# Patient Record
Sex: Male | Born: 1975 | Race: Black or African American | Hispanic: No | Marital: Single | Smoking: Current every day smoker
Health system: Southern US, Community
[De-identification: ages and names within clinical notes are randomized; demographics above are authoritative.]

## PROBLEM LIST (undated history)

## (undated) DIAGNOSIS — N200 Calculus of kidney: Secondary | ICD-10-CM

## (undated) HISTORY — PX: OTHER SURGICAL HISTORY: SHX169

---

## 2006-09-11 ENCOUNTER — Ambulatory Visit (HOSPITAL_BASED_OUTPATIENT_CLINIC_OR_DEPARTMENT_OTHER): Admission: RE | Admit: 2006-09-11 | Discharge: 2006-09-11 | Payer: Self-pay | Admitting: Urology

## 2006-09-11 ENCOUNTER — Emergency Department (HOSPITAL_COMMUNITY): Admission: EM | Admit: 2006-09-11 | Discharge: 2006-09-11 | Payer: Self-pay | Admitting: Emergency Medicine

## 2007-02-01 ENCOUNTER — Emergency Department (HOSPITAL_COMMUNITY): Admission: EM | Admit: 2007-02-01 | Discharge: 2007-02-01 | Payer: Self-pay | Admitting: Emergency Medicine
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
## 2011-05-09 LAB — URINALYSIS, ROUTINE W REFLEX MICROSCOPIC
Bilirubin Urine: NEGATIVE
Nitrite: NEGATIVE
Urobilinogen, UA: 0.2

## 2011-05-09 LAB — I-STAT 8, (EC8 V) (CONVERTED LAB)
BUN: 15
Bicarbonate: 24.1 — ABNORMAL HIGH
Chloride: 108
Glucose, Bld: 88
Potassium: 4
Sodium: 139

## 2011-05-09 LAB — COMPREHENSIVE METABOLIC PANEL
ALT: 36
AST: 48 — ABNORMAL HIGH
BUN: 14
Calcium: 8.9
Glucose, Bld: 89
Potassium: 4
Total Protein: 7

## 2011-05-09 LAB — POCT I-STAT CREATININE
Creatinine, Ser: 1.2
Operator id: 198171

## 2011-05-09 LAB — URINE CULTURE
Colony Count: NO GROWTH
Culture: NO GROWTH

## 2011-05-09 LAB — URINE MICROSCOPIC-ADD ON

## 2011-09-29 ENCOUNTER — Emergency Department (HOSPITAL_COMMUNITY)
Admission: EM | Admit: 2011-09-29 | Discharge: 2011-09-29 | Disposition: A | Discharge status: Home / Self Care | Payer: Self-pay | Attending: Emergency Medicine | Admitting: Emergency Medicine

## 2011-09-29 ENCOUNTER — Emergency Department (HOSPITAL_COMMUNITY): Payer: Self-pay

## 2011-09-29 ENCOUNTER — Encounter (HOSPITAL_COMMUNITY): Payer: Self-pay | Admitting: Emergency Medicine

## 2011-09-29 DIAGNOSIS — B9789 Other viral agents as the cause of diseases classified elsewhere: Secondary | ICD-10-CM | POA: Insufficient documentation

## 2011-09-29 DIAGNOSIS — R0602 Shortness of breath: Secondary | ICD-10-CM | POA: Insufficient documentation

## 2011-09-29 DIAGNOSIS — R197 Diarrhea, unspecified: Secondary | ICD-10-CM | POA: Insufficient documentation

## 2011-09-29 DIAGNOSIS — F17200 Nicotine dependence, unspecified, uncomplicated: Secondary | ICD-10-CM | POA: Insufficient documentation

## 2011-09-29 DIAGNOSIS — R05 Cough: Secondary | ICD-10-CM | POA: Insufficient documentation

## 2011-09-29 DIAGNOSIS — B349 Viral infection, unspecified: Secondary | ICD-10-CM

## 2011-09-29 DIAGNOSIS — J45909 Unspecified asthma, uncomplicated: Secondary | ICD-10-CM | POA: Insufficient documentation

## 2011-09-29 DIAGNOSIS — R112 Nausea with vomiting, unspecified: Secondary | ICD-10-CM | POA: Insufficient documentation

## 2011-09-29 DIAGNOSIS — R059 Cough, unspecified: Secondary | ICD-10-CM | POA: Insufficient documentation

## 2011-09-29 DIAGNOSIS — J3489 Other specified disorders of nose and nasal sinuses: Secondary | ICD-10-CM | POA: Insufficient documentation

## 2011-09-29 MED ORDER — ONDANSETRON HCL 4 MG PO TABS: 4.0000 mg | ORAL_TABLET | Freq: Four times a day (QID) | ORAL | Status: AC | PRN

## 2011-09-29 MED ORDER — IPRATROPIUM BROMIDE 0.02 % IN SOLN
0.5000 mg | Freq: Once | RESPIRATORY_TRACT | Status: AC
  Administered 2011-09-29: 0.5 mg via RESPIRATORY_TRACT
  Filled 2011-09-29: qty 2.5

## 2011-09-29 MED ORDER — ALBUTEROL SULFATE (5 MG/ML) 0.5% IN NEBU
5.0000 mg | INHALATION_SOLUTION | Freq: Once | RESPIRATORY_TRACT | Status: AC
  Administered 2011-09-29: 5 mg via RESPIRATORY_TRACT
  Filled 2011-09-29: qty 1

## 2011-09-29 MED ORDER — DICYCLOMINE HCL 10 MG PO CAPS
20.0000 mg | ORAL_CAPSULE | Freq: Once | ORAL | Status: AC
  Administered 2011-09-29: 20 mg via ORAL
  Filled 2011-09-29: qty 2

## 2011-09-29 NOTE — ED Notes (Signed)
Pt here for flu sx with vomiting and generalized body aches and fever x 2 days

## 2011-09-29 NOTE — Discharge Instructions (Signed)
Viral Infections A virus is a type of germ. Viruses can cause:  Minor sore throats.   Aches and pains.   Headaches.   Runny nose.   Rashes.   Watery eyes.   Tiredness.   Coughs.   Loss of appetite.   Feeling sick to your stomach (nausea).   Throwing up (vomiting).   Watery poop (diarrhea).  HOME CARE   Only take medicines as told by your doctor.   Drink enough water and fluids to keep your pee (urine) clear or pale yellow. Sports drinks are a good choice.   Get plenty of rest and eat healthy. Soups and broths with crackers or rice are fine.  GET HELP RIGHT AWAY IF:   You have a very bad headache.   You have shortness of breath.   You have chest pain or neck pain.   You have an unusual rash.   You cannot stop throwing up.   You have watery poop that does not stop.   You cannot keep fluids down.   You or your child has a temperature by mouth above 102 F (38.9 C), not controlled by medicine.   Your baby is older than 3 months with a rectal temperature of 102 F (38.9 C) or higher.   Your baby is 12 months old or younger with a rectal temperature of 100.4 F (38 C) or higher.  MAKE SURE YOU:   Understand these instructions.   Will watch this condition.   Will get help right away if you are not doing well or get worse.  Document Released: 06/22/2008 Document Revised: 06/29/2011 Document Reviewed: 11/15/2010 [facility] Patient Information 2012 [place], [place].            Antibiotic Nonuse  Your caregiver felt that the infection or problem was not one that would be helped with an antibiotic. Infections may be caused by viruses or bacteria. Only a caregiver can tell which one of these is the likely cause of an illness. A cold is the most common cause of infection in both adults and children. A cold is a virus. Antibiotic treatment will have no effect on a viral infection. Viruses can lead to many lost days of work caring for sick children and many  missed days of school. Children may catch as many as 10 "colds" or "flus" per year during which they can be tearful, cranky, and uncomfortable. The goal of treating a virus is aimed at keeping the ill person comfortable. Antibiotics are medications used to help the body fight bacterial infections. There are relatively few types of bacteria that cause infections but there are hundreds of viruses. While both viruses and bacteria cause infection they are very different types of germs. A viral infection will typically go away by itself within 7 to 10 days. Bacterial infections may spread or get worse without antibiotic treatment. Examples of bacterial infections are:  Sore throats (like strep throat or tonsillitis).   Infection in the lung (pneumonia).   Ear and skin infections.  Examples of viral infections are:  Colds or flus.   Most coughs and bronchitis.   Sore throats not caused by Strep.   Runny noses.  It is often best not to take an antibiotic when a viral infection is the cause of the problem. Antibiotics can kill off the helpful bacteria that we have inside our body and allow harmful bacteria to start growing. Antibiotics can cause side effects such as allergies, nausea, and diarrhea without helping to improve  the symptoms of the viral infection. Additionally, repeated uses of antibiotics can cause bacteria inside of our body to become resistant. That resistance can be passed onto harmful bacterial. The next time you have an infection it may be harder to treat if antibiotics are used when they are not needed. Not treating with antibiotics allows our own immune system to develop and take care of infections more efficiently. Also, antibiotics will work better for [place] when they are prescribed for bacterial infections. Treatments for a child that is ill may include:  Give extra fluids throughout the day to stay hydrated.   Get plenty of rest.   Only give your child over-the-counter or  prescription medicines for pain, discomfort, or fever as directed by your caregiver.   The use of a cool mist humidifier may help stuffy noses.   Cold medications if suggested by your caregiver.  Your caregiver may decide to start you on an antibiotic if:  The problem you were seen for today continues for a longer length of time than expected.   You develop a secondary bacterial infection.  SEEK MEDICAL CARE IF:  Fever lasts longer than 5 days.   Symptoms continue to get worse after 5 to 7 days or become severe.   Difficulty in breathing develops.   Signs of dehydration develop (poor drinking, rare urinating, dark colored urine).   Changes in behavior or worsening tiredness (listlessness or lethargy).  Document Released: 09/18/2001 Document Revised: 06/29/2011 Document Reviewed: 03/17/2009 [facility] Patient Information 2012 [place], [place].

## 2011-09-29 NOTE — ED Provider Notes (Signed)
History     CSN: [id number]  Arrival date & time 09/29/11  1241   First MD Initiated Contact with Patient 09/29/11 1423      Chief Complaint  Patient presents with  . Influenza  . Emesis    (Consider location/radiation/quality/duration/timing/severity/associated sxs/prior treatment) The history is provided by the patient.  Pt is a 36 y/o M with a hx of asthma who presents to ED with c/c of 2 days acute onset N/V/D with mild intermittent generalized moderate intensity crampy abdominal pain. Vomit non-bloody, appearance c/w undigested food. Diarrhea non-bloody. In the same timeframe, he has had chills, nasal congestion, ear fullness, sore throat, non-prod cough, mild shortness of breath, and myalgias. Denies known fever. Denies shortness of breath. Nothing makes his symptoms better or worse. No prior tx has been attempted. No recent travel. No known sick contacts.   Past Medical History  Diagnosis Date  . Asthma     History reviewed. No pertinent past surgical history.  History reviewed. No pertinent family history.  History  Substance Use Topics  . Smoking status: Current Everyday Smoker  . Smokeless tobacco: Not on file  . Alcohol Use: Yes      Review of Systems 10 systems reviewed and are negative for acute change except as noted in the HPI.  Allergies  Review of patient's allergies indicates no known allergies.  Home Medications   Current Outpatient Rx  Name Route Sig Dispense Refill  . ALBUTEROL SULFATE HFA 108 (90 BASE) MCG/ACT IN AERS Inhalation Inhale 2 puffs into the lungs every 6 (six) hours as needed. For shortness of breath      BP 128/80  Pulse 87  Temp(Src) 98.6 F (37 C) (Oral)  Resp 18  Ht 5\' 7"  (1.702 m)  Wt 160 lb (72.576 kg)  BMI 25.06 kg/m2  SpO2 97%  Physical Exam  Nursing note and vitals reviewed. Constitutional: He is oriented to person, place, and time. He appears well-developed and well-nourished.       Ill-appearing  HENT:  Head:  Normocephalic and atraumatic.  Right Ear: External ear normal.  Left Ear: External ear normal.       Bilateral TM injected. Clear nasal congestion. Pharynx erythematous without edema or exudate.   Eyes: Conjunctivae are normal. Pupils are equal, round, and reactive to light.  Neck: Normal range of motion. Neck supple.  Cardiovascular: Normal rate, regular rhythm, normal heart sounds and intact distal pulses.   Pulmonary/Chest: Effort normal. No respiratory distress. He has decreased breath sounds in the right upper field and the left upper field. He has no wheezes. He has no rhonchi.  Abdominal: Soft. Bowel sounds are normal. He exhibits no distension.  Musculoskeletal: He exhibits no edema and no tenderness.  Lymphadenopathy:    He has no cervical adenopathy.  Neurological: He is alert and oriented to person, place, and time. No cranial nerve deficit.  Skin: Skin is warm and dry. No rash noted.  Psychiatric: He has a normal mood and affect.    ED Course  Procedures (including critical care time)  Labs Reviewed - No data to display Dg Chest 2 View  09/29/2011  *RADIOLOGY REPORT*  Clinical Data: Cough, congestion and shortness of breath.  CHEST - 2 VIEW  Comparison: No priors.  Findings: Lung volumes are normal.  No consolidative airspace disease.  No pleural effusions.  No pneumothorax.  No pulmonary nodule or mass noted.  Pulmonary vasculature and the cardiomediastinal silhouette are within normal limits.  IMPRESSION: 1. No  radiographic evidence of acute cardiopulmonary disease.  Original Report Authenticated By: Florencia Reasons, M.D.     1. Viral illness       MDM  Viral illness s/s. AF, VSS. CXR with no evidence of pneumonia, good air movement after neb breathing tx- pt advised to use at-home inhaler. He is tolerated a by mouth trial in emergency department. There've been no further episodes of vomiting or diarrhea in the ER. He will be discharged home with a prescription for an  antiemetic. As he has had no fever, I do not suspect influenza in this patient.        Shaaron Adler, [place] 09/29/11 1750

## 2011-09-29 NOTE — ED Notes (Signed)
Ambulatory with relief of pain now rated at 6/10.  No N./V/D at this time.

## 2011-09-29 NOTE — ED Provider Notes (Signed)
Medical screening examination/treatment/procedure(s) were performed by non-physician practitioner and as supervising physician I was immediately available for consultation/collaboration.  Ethelda Chick, MD 09/29/11 [phone]

## 2011-09-29 NOTE — ED Notes (Signed)
Pt given lunch bag and sprite 

## 2011-10-28 ENCOUNTER — Encounter (HOSPITAL_COMMUNITY): Payer: Self-pay | Admitting: *Deleted

## 2011-10-28 ENCOUNTER — Emergency Department (HOSPITAL_COMMUNITY): Payer: Self-pay

## 2011-10-28 ENCOUNTER — Emergency Department (HOSPITAL_COMMUNITY)
Admission: EM | Admit: 2011-10-28 | Discharge: 2011-10-28 | Disposition: A | Discharge status: Home / Self Care | Payer: Self-pay | Attending: Emergency Medicine | Admitting: Emergency Medicine

## 2011-10-28 DIAGNOSIS — J45909 Unspecified asthma, uncomplicated: Secondary | ICD-10-CM | POA: Insufficient documentation

## 2011-10-28 DIAGNOSIS — F17200 Nicotine dependence, unspecified, uncomplicated: Secondary | ICD-10-CM | POA: Insufficient documentation

## 2011-10-28 DIAGNOSIS — M549 Dorsalgia, unspecified: Secondary | ICD-10-CM | POA: Insufficient documentation

## 2011-10-28 DIAGNOSIS — R109 Unspecified abdominal pain: Secondary | ICD-10-CM | POA: Insufficient documentation

## 2011-10-28 LAB — URINALYSIS, ROUTINE W REFLEX MICROSCOPIC
Ketones, ur: 15 mg/dL — AB
Protein, ur: NEGATIVE mg/dL
Specific Gravity, Urine: 1.024 (ref 1.005–1.030)
pH: 6 (ref 5.0–8.0)

## 2011-10-28 LAB — COMPREHENSIVE METABOLIC PANEL
Albumin: 4 g/dL (ref 3.5–5.2)
Alkaline Phosphatase: 101 U/L (ref 39–117)
BUN: 8 mg/dL (ref 6–23)
Calcium: 9 mg/dL (ref 8.4–10.5)
Creatinine, Ser: 0.83 mg/dL (ref 0.50–1.35)
Total Bilirubin: 0.2 mg/dL — ABNORMAL LOW (ref 0.3–1.2)

## 2011-10-28 LAB — DIFFERENTIAL
Basophils Absolute: 0 10*3/uL (ref 0.0–0.1)
Lymphocytes Relative: 23 % (ref 12–46)
Lymphs Abs: 2.1 10*3/uL (ref 0.7–4.0)
Neutrophils Relative %: 61 % (ref 43–77)

## 2011-10-28 LAB — CBC
HCT: 44.5 % (ref 39.0–52.0)
MCH: 30.9 pg (ref 26.0–34.0)
MCV: 92.3 fL (ref 78.0–100.0)
RBC: 4.82 MIL/uL (ref 4.22–5.81)
RDW: 12.6 % (ref 11.5–15.5)

## 2011-10-28 MED ORDER — TRAMADOL HCL 50 MG PO TABS: 50.0000 mg | ORAL_TABLET | Freq: Four times a day (QID) | ORAL | Status: AC | PRN

## 2011-10-28 MED ORDER — ONDANSETRON HCL 4 MG/2ML IJ SOLN
4.0000 mg | Freq: Once | INTRAMUSCULAR | Status: AC
  Administered 2011-10-28: 4 mg via INTRAVENOUS
  Filled 2011-10-28: qty 2

## 2011-10-28 MED ORDER — KETOROLAC TROMETHAMINE 30 MG/ML IJ SOLN
30.0000 mg | Freq: Once | INTRAMUSCULAR | Status: AC
  Administered 2011-10-28: 30 mg via INTRAVENOUS
  Filled 2011-10-28: qty 1

## 2011-10-28 MED ORDER — PROMETHAZINE HCL 25 MG PO TABS: 25.0000 mg | ORAL_TABLET | Freq: Four times a day (QID) | ORAL | Status: DC | PRN

## 2011-10-28 MED ORDER — SODIUM CHLORIDE 0.9 % IV SOLN
Freq: Once | INTRAVENOUS | Status: AC
  Administered 2011-10-28: 09:00:00 via INTRAVENOUS

## 2011-10-28 NOTE — ED Notes (Signed)
To ed for eval of left flank pain. Hx of kidney stones with same symptoms.

## 2011-10-28 NOTE — ED Provider Notes (Signed)
History     CSN: [id number]  Arrival date & time 10/28/11  4098   First MD Initiated Contact with Patient 10/28/11 [phone]      Chief Complaint  Patient presents with  . Flank Pain    (Consider location/radiation/quality/duration/timing/severity/associated sxs/prior treatment) Patient is a 36 y.o. male presenting with flank pain. The history is provided by the patient (Patient complains of left flank pain he states he's had a kidney stone before and this feels similar. Mild nausea and sweating). No language interpreter was used.  Flank Pain This is a new problem. The current episode started yesterday. The problem occurs constantly. The problem has not changed since onset.Pertinent negatives include no chest pain, no abdominal pain and no headaches. The symptoms are aggravated by nothing. The symptoms are relieved by nothing. He has tried nothing for the symptoms. The treatment provided no relief.    Past Medical History  Diagnosis Date  . Asthma   . Renal disorder     History reviewed. No pertinent past surgical history.  History reviewed. No pertinent family history.  History  Substance Use Topics  . Smoking status: Current Everyday Smoker  . Smokeless tobacco: Not on file  . Alcohol Use: Yes      Review of Systems  Constitutional: Negative for fatigue.  HENT: Negative for congestion, sinus pressure and ear discharge.   Eyes: Negative for discharge.  Respiratory: Negative for cough.   Cardiovascular: Negative for chest pain.  Gastrointestinal: Negative for abdominal pain and diarrhea.  Genitourinary: Positive for flank pain. Negative for frequency and hematuria.  Musculoskeletal: Negative for back pain.  Skin: Negative for rash.  Neurological: Negative for seizures and headaches.  Hematological: Negative.   Psychiatric/Behavioral: Negative for hallucinations.    Allergies  Review of patient's allergies indicates no known allergies.  Home Medications   Current  Outpatient Rx  Name Route Sig Dispense Refill  . ALBUTEROL SULFATE HFA 108 (90 BASE) MCG/ACT IN AERS Inhalation Inhale 2 puffs into the lungs every 6 (six) hours as needed. For shortness of breath    . ALBUTEROL SULFATE (2.5 MG/3ML) 0.083% IN NEBU Nebulization Take 2.5 mg by nebulization every 6 (six) hours as needed. As needed for asthma.    Marland Kitchen PROMETHAZINE HCL 25 MG PO TABS Oral Take 1 tablet (25 mg total) by mouth every 6 (six) hours as needed for nausea. 15 tablet 0  . TRAMADOL HCL 50 MG PO TABS Oral Take 1 tablet (50 mg total) by mouth every 6 (six) hours as needed for pain. 20 tablet 0    BP 105/59  Pulse 67  Temp(Src) 98 F (36.7 C) (Oral)  Resp 20  SpO2 98%  Physical Exam  Constitutional: He is oriented to person, place, and time. He appears well-developed.  HENT:  Head: Normocephalic and atraumatic.  Eyes: Conjunctivae and EOM are normal. No scleral icterus.  Neck: Neck supple. No thyromegaly present.  Cardiovascular: Normal rate and regular rhythm.  Exam reveals no gallop and no friction rub.   No murmur heard. Pulmonary/Chest: No stridor. He has no wheezes. He has no rales. He exhibits no tenderness.  Abdominal: He exhibits no distension. There is no tenderness. There is no rebound.  Genitourinary:       Mild left flank pain  Musculoskeletal: Normal range of motion. He exhibits no edema.  Lymphadenopathy:    He has no cervical adenopathy.  Neurological: He is oriented to person, place, and time. Coordination normal.  Skin: No rash noted. No erythema.  Psychiatric: He has a normal mood and affect. His behavior is normal.    ED Course  Procedures (including critical care time)  Labs Reviewed  DIFFERENTIAL - Abnormal; Notable for the following:    Eosinophils Relative 9 (*)    Eosinophils Absolute 0.8 (*)    All other components within normal limits  COMPREHENSIVE METABOLIC PANEL - Abnormal; Notable for the following:    AST 47 (*)    Total Bilirubin 0.2 (*)     All other components within normal limits  URINALYSIS, ROUTINE W REFLEX MICROSCOPIC - Abnormal; Notable for the following:    Ketones, ur 15 (*)    All other components within normal limits  CBC   Ct Abdomen Pelvis Wo Contrast  10/28/2011  *RADIOLOGY REPORT*  Clinical Data: Left flank pain, hematuria  CT ABDOMEN AND PELVIS WITHOUT CONTRAST  Technique:  Multidetector CT imaging of the abdomen and pelvis was performed following the standard protocol without intravenous contrast.  Comparison: 02/01/2007  Findings: Sagittal images of the spine are unremarkable.  Lung bases are unremarkable.  The unenhanced liver, spleen, pancreas and adrenals are unremarkable.  Unenhanced kidneys are symmetrical in size.  There is nonobstructive calcified calculus mid pole of the right kidney measures 4 mm.  No hydronephrosis or hydroureter.  No calcified ureteral calculi are identified.  No aortic aneurysm.  No small bowel obstruction.  No ascites or free air.  No adenopathy.  Normal appendix is partially visualized axial image 66.  Bilateral distal ureter is unremarkable.  Pelvic phleboliths are stable from prior exam.  Some gas noted in the rectum.  No destructive bony lesions are noted within pelvis. No calcified calculi are noted within urinary bladder.  IMPRESSION:  1.  There is right nonobstructive nephrolithiasis.  No hydronephrosis or hydroureter. 2.  No calcified ureteral calculi are noted. 3.  Normal appendix is clearly visualized. 4.  No calcified calculi are noted within urinary bladder.  Original Report Authenticated By: Natasha Mead, M.D.     1. Back pain       MDM  Flank pain,  Possible passed kidney stone        Benny Lennert, MD 10/28/11 1429

## 2011-10-28 NOTE — ED Notes (Signed)
Pt reporting left sided flank pain x 1 day. Reports yesterday developed throbbing left flank pain. Pain has persisted. Urine is brown/dark. Denying any hematuria. Reporting burning upon urination. Hx kidney stones, last one 2 years ago. Pt a & o x 4. Ambulated from triage to room without difficulty.

## 2011-10-28 NOTE — Discharge Instructions (Signed)
Drink plenty of fluids  Follow up as needed

## 2011-11-03 ENCOUNTER — Emergency Department (HOSPITAL_COMMUNITY)
Admission: EM | Admit: 2011-11-03 | Discharge: 2011-11-04 | Disposition: A | Discharge status: Home / Self Care | Payer: Self-pay | Attending: Emergency Medicine | Admitting: Emergency Medicine

## 2011-11-03 ENCOUNTER — Encounter (HOSPITAL_COMMUNITY): Payer: Self-pay | Admitting: Emergency Medicine

## 2011-11-03 DIAGNOSIS — F3289 Other specified depressive episodes: Secondary | ICD-10-CM | POA: Insufficient documentation

## 2011-11-03 DIAGNOSIS — F329 Major depressive disorder, single episode, unspecified: Secondary | ICD-10-CM

## 2011-11-03 DIAGNOSIS — J45909 Unspecified asthma, uncomplicated: Secondary | ICD-10-CM | POA: Insufficient documentation

## 2011-11-03 DIAGNOSIS — R45851 Suicidal ideations: Secondary | ICD-10-CM | POA: Insufficient documentation
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
## 2011-11-04 LAB — CBC
HCT: 39.5 % (ref 39.0–52.0)
MCV: 93.4 fL (ref 78.0–100.0)
RDW: 13.4 % (ref 11.5–15.5)
WBC: 8.7 10*3/uL (ref 4.0–10.5)

## 2011-11-04 LAB — COMPREHENSIVE METABOLIC PANEL
BUN: 12 mg/dL (ref 6–23)
CO2: 24 mEq/L (ref 19–32)
Chloride: 104 mEq/L (ref 96–112)
Creatinine, Ser: 0.93 mg/dL (ref 0.50–1.35)
GFR calc Af Amer: >90 mL/min (ref >90)
GFR calc non Af Amer: >90 mL/min (ref >90)
Total Bilirubin: 0.3 mg/dL (ref 0.3–1.2)

## 2011-11-04 LAB — ACETAMINOPHEN LEVEL: Acetaminophen (Tylenol), Serum: <15 ug/mL (ref 10–30)

## 2011-11-04 LAB — ETHANOL: Alcohol, Ethyl (B): <11 mg/dL (ref 0–11)

## 2011-11-04 MED ORDER — LORAZEPAM 1 MG PO TABS: 1.0000 mg | ORAL_TABLET | Freq: Three times a day (TID) | ORAL | Status: DC | PRN

## 2011-11-04 MED ORDER — ALBUTEROL SULFATE HFA 108 (90 BASE) MCG/ACT IN AERS: 2.0000 | INHALATION_SPRAY | RESPIRATORY_TRACT | Status: DC | PRN

## 2011-11-04 MED ORDER — CITALOPRAM HYDROBROMIDE 20 MG PO TABS: 20.0000 mg | ORAL_TABLET | ORAL | Status: DC

## 2011-11-04 MED ORDER — NICOTINE 21 MG/24HR TD PT24
21.0000 mg | MEDICATED_PATCH | Freq: Every day | TRANSDERMAL | Status: DC
  Administered 2011-11-04: 21 mg via TRANSDERMAL
  Filled 2011-11-04: qty 1

## 2011-11-04 NOTE — ED Provider Notes (Addendum)
Patient had to psych consultation today. She has been cleared for discharge by the telemetry psychiatrist. He'll be placed on Celexa per their recommendations and will be given outpatient referrals At time of d/c, pt without si/hi. He has been given outpateint referalls. Toy Baker, MD 11/04/11 1610  Toy Baker, MD 11/04/11 1342  Toy Baker, MD 11/04/11 [phone]

## 2011-11-04 NOTE — ED Notes (Signed)
Patient resting quietly in bed with eyes closed. resp even and unlabored. Sitter remains at bedside.  Assumed care of patient.

## 2011-11-04 NOTE — ED Notes (Signed)
Asleep; sitter remains at bedside.

## 2011-11-04 NOTE — BH Assessment (Signed)
Assessment Note   [name] is a 36 y.o. male who presents to [facility] under IVC, endorsing SI with plan to hang himself. Pt reports this week he lost his job and discovered that his girlfriend has been cheating on him. Pt reports "life is not worth living anymore, I should not be on this earth." Pt reports he is "tired of trying" and that he has frequent crying spells. He states he has been depressed on and off through his life, but never to this extent. Pt reports that he has not attempted suicide because of his children. He states that he has been attempting to get custody of his 2 children, he states he worries constantly about "what I'll do if I can't get them." Pt denies past suicide attempts, past hospitalizations, and reports no current psychiatrist or therapist. Pt also states he is not on any medications. Pt denies any SA but is positive for cocaine and THC. He does report a history of SA. Pt denies HI, stating he would hurt himself before hurting others. Pt was tearful during assessment. Pt was calm and cooperative and oriented x4.                Axis I: Major Depression, single episode Axis II: Deferred Axis III:  Past Medical History  Diagnosis Date  . Asthma   . Renal disorder    Axis IV: other psychosocial or environmental problems and problems related to social environment Axis V: 21-30 behavior considerably influenced by delusions or hallucinations OR serious impairment in judgment, communication OR inability to function in almost all areas  Past Medical History:  Past Medical History  Diagnosis Date  . Asthma   . Renal disorder     History reviewed. No pertinent past surgical history.  Family History: History reviewed. No pertinent family history.  Social History:  reports that he has been smoking.  He does not have any smokeless tobacco history on file. He reports that he drinks alcohol. He reports that he does not use illicit drugs.  Additional Social History:   Alcohol / Drug Use History of alcohol / drug use?: Yes Allergies: No Known Allergies  Home Medications:  Medications Prior to Admission  Medication Dose Route Frequency Provider Last Rate Last Dose  . albuterol (PROVENTIL HFA;VENTOLIN HFA) 108 (90 BASE) MCG/ACT inhaler 2 puff  2 puff Inhalation Q4H PRN Suzi Roots, MD      . LORazepam (ATIVAN) tablet 1 mg  1 mg Oral Q8H PRN Suzi Roots, MD      . nicotine (NICODERM CQ - dosed in mg/24 hours) patch 21 mg  21 mg Transdermal Daily Suzi Roots, MD       Medications Prior to Admission  Medication Sig Dispense Refill  . albuterol (PROVENTIL HFA;VENTOLIN HFA) 108 (90 BASE) MCG/ACT inhaler Inhale 2 puffs into the lungs every 6 (six) hours as needed. For shortness of breath      . albuterol (PROVENTIL) (2.5 MG/3ML) 0.083% nebulizer solution Take 2.5 mg by nebulization every 6 (six) hours as needed. As needed for asthma.      . promethazine (PHENERGAN) 25 MG tablet Take 1 tablet (25 mg total) by mouth every 6 (six) hours as needed for nausea.  15 tablet  0  . traMADol (ULTRAM) 50 MG tablet Take 1 tablet (50 mg total) by mouth every 6 (six) hours as needed for pain.  20 tablet  0    OB/GYN Status:  No LMP for male patient.  General Assessment Data Location of Assessment: [facility] Living Arrangements: Other relatives;Spouse/significant other (was with girlfriend is currently with sister) Can pt return to current living arrangement?: Yes Admission Status: Involuntary Is patient capable of signing voluntary admission?: Yes Transfer from: Acute Hospital Referral Source: Self/Family/Friend  Education Status Is patient currently in school?: No  Risk to self Suicidal Ideation: Yes-Currently Present Suicidal Intent: Yes-Currently Present Is patient at risk for suicide?: Yes Suicidal Plan?: Yes-Currently Present Specify Current Suicidal Plan: to hang himself Access to Means: Yes Specify Access to Suicidal Means: rope What has been your use  of drugs/alcohol within the last 12 months?: reports history of polysubstance abuse, denies current use Previous Attempts/Gestures: No How many times?: 0  Other Self Harm Risks: none Triggers for Past Attempts: None known Intentional Self Injurious Behavior: None Family Suicide History: No Recent stressful life event(s): Conflict (Comment);Job Loss (discoverd girlfriend has been cheating on him) Persecutory voices/beliefs?: No Depression: Yes Depression Symptoms: Despondent;Tearfulness;Loss of interest in usual pleasures;Feeling worthless/self pity;Feeling angry/irritable Substance abuse history and/or treatment for substance abuse?: Yes Suicide prevention information given to non-admitted patients: Not applicable  Risk to Others Homicidal Ideation: No Thoughts of Harm to Others: No Current Homicidal Intent: No Current Homicidal Plan: No Access to Homicidal Means: No Identified Victim: none History of harm to others?: No Assessment of Violence: None Noted Violent Behavior Description: none Does patient have access to weapons?: No Criminal Charges Pending?: Yes Describe Pending Criminal Charges: pawning items that were from a breaking and entering case  Does patient have a court date: Yes Court Date: 11/11/11 (reports april 20 something)  Psychosis Hallucinations: None noted Delusions: None noted  Mental Status Report Appear/Hygiene:  (casual) Eye Contact: Fair Motor Activity: Unremarkable Speech: Logical/coherent Level of Consciousness: Alert Mood: Depressed;Sad Affect: Depressed;Sad Anxiety Level: None Thought Processes: Coherent;Relevant Judgement: Impaired Orientation: Person;Place;Time;Situation Obsessive Compulsive Thoughts/Behaviors: None  Cognitive Functioning Concentration: Normal Memory: Recent Intact;Remote Intact IQ: Average Insight: Fair Impulse Control: Poor Appetite: Poor Weight Loss: 0  Weight Gain: 0  Sleep: Decreased Total Hours of Sleep:   (reports he hasn't really slept in days) Vegetative Symptoms: None  Prior Inpatient Therapy Prior Inpatient Therapy: No Prior Therapy Dates: n/a Prior Therapy Facilty/Provider(s): n/a Reason for Treatment: n/a  Prior Outpatient Therapy Prior Outpatient Therapy: No Prior Therapy Dates: n/a Prior Therapy Facilty/Provider(s): n/a Reason for Treatment: n/a  ADL Screening (condition at time of admission) Patient's cognitive ability adequate to safely complete daily activities?: Yes Patient able to express need for assistance with ADLs?: Yes Independently performs ADLs?: Yes Weakness of Legs: None Weakness of Arms/Hands: None  Home Assistive Devices/Equipment Home Assistive Devices/Equipment: None    Abuse/Neglect Assessment (Assessment to be complete while patient is alone) Physical Abuse: Denies Verbal Abuse: Denies Sexual Abuse: Denies Exploitation of patient/patient's resources: Denies Self-Neglect: Denies     Merchant navy officer (For Healthcare) Advance Directive: Patient does not have advance directive Nutrition Screen Diet: Regular Unintentional weight loss greater than 10lbs within the last month: No Home Tube Feeding or Total Parenteral Nutrition (TPN): No Patient appears severely malnourished: No  Additional Information 1:1 In Past 12 Months?: No CIRT Risk: No Elopement Risk: No Does patient have medical clearance?: Yes     Disposition:  Disposition Disposition of Patient: Referred to;Inpatient treatment program Type of inpatient treatment program: Adult  On Site Evaluation by:   Reviewed with Physician:     Georgina Quint A 11/04/2011 6:07 AM

## 2011-11-04 NOTE — ED Notes (Signed)
Pt not willing to cooperate at this time to obtain blood work, Charity fundraiser will attempt to obtain labs with the start of IV

## 2011-11-04 NOTE — ED Provider Notes (Signed)
History     CSN: [id number]  Arrival date & time 11/03/11  2325   First MD Initiated Contact with Patient 11/04/11 0047      Chief Complaint  Patient presents with  . Suicidal    (Consider location/radiation/quality/duration/timing/severity/associated sxs/prior treatment) The history is provided by the patient.  pt c/o hx depression, and feeling very depressed in past few weeks. States several life and family stressors making depression worse. Has thoughts of hanging self. Denies any attempt at self harm or overdose. Denies any current recent physical symptoms, illness, or complaints. States recent tx for kidney stone but those symptoms have resolved. Hx asthma. Denies sob. Denies etoh or substance abuse. No fever or chills.   Past Medical History  Diagnosis Date  . Asthma   . Renal disorder     History reviewed. No pertinent past surgical history.  History reviewed. No pertinent family history.  History  Substance Use Topics  . Smoking status: Current Everyday Smoker  . Smokeless tobacco: Not on file  . Alcohol Use: Yes      Review of Systems  Constitutional: Negative for fever.  HENT: Negative for neck pain.   Eyes: Negative for discharge.  Respiratory: Negative for shortness of breath.   Cardiovascular: Negative for chest pain.  Gastrointestinal: Negative for abdominal pain.  Genitourinary: Negative for flank pain.  Musculoskeletal: Negative for back pain.  Skin: Negative for rash.  Neurological: Negative for headaches.  Hematological: Does not bruise/bleed easily.  Psychiatric/Behavioral: Positive for dysphoric mood.    Allergies  Review of patient's allergies indicates no known allergies.  Home Medications   Current Outpatient Rx  Name Route Sig Dispense Refill  . ALBUTEROL SULFATE HFA 108 (90 BASE) MCG/ACT IN AERS Inhalation Inhale 2 puffs into the lungs every 6 (six) hours as needed. For shortness of breath    . ALBUTEROL SULFATE (2.5 MG/3ML) 0.083%  IN NEBU Nebulization Take 2.5 mg by nebulization every 6 (six) hours as needed. As needed for asthma.    Marland Kitchen PROMETHAZINE HCL 25 MG PO TABS Oral Take 1 tablet (25 mg total) by mouth every 6 (six) hours as needed for nausea. 15 tablet 0  . TRAMADOL HCL 50 MG PO TABS Oral Take 1 tablet (50 mg total) by mouth every 6 (six) hours as needed for pain. 20 tablet 0    BP 122/78  Pulse 69  Temp(Src) 97.9 F (36.6 C) (Oral)  Resp 18  SpO2 98%  Physical Exam  Nursing note and vitals reviewed. Constitutional: He is oriented to person, place, and time. He appears well-developed and well-nourished. No distress.  HENT:  Head: Atraumatic.  Eyes: Pupils are equal, round, and reactive to light. No scleral icterus.  Neck: Neck supple. No tracheal deviation present.  Cardiovascular: Normal rate, regular rhythm, normal heart sounds and intact distal pulses.   Pulmonary/Chest: Effort normal and breath sounds normal. No accessory muscle usage. No respiratory distress.  Abdominal: Soft. He exhibits no distension. There is no tenderness.  Musculoskeletal: Normal range of motion. He exhibits no edema and no tenderness.  Neurological: He is alert and oriented to person, place, and time.       Motor intact bil. Steady gait.   Skin: Skin is warm and dry.  Psychiatric:       Depressed mood, flat affect.     ED Course  Procedures (including critical care time)  Labs Reviewed  URINE RAPID DRUG SCREEN (HOSP PERFORMED) - Abnormal; Notable for the following:    Cocaine  POSITIVE (*)    Tetrahydrocannabinol POSITIVE (*)    All other components within normal limits  CBC  COMPREHENSIVE METABOLIC PANEL  ETHANOL  ACETAMINOPHEN LEVEL    Results for orders placed during the hospital encounter of 11/03/11  URINE RAPID DRUG SCREEN (HOSP PERFORMED)      Component Value Range   Opiates NONE DETECTED  NONE DETECTED    Cocaine POSITIVE (*) NONE DETECTED    Benzodiazepines NONE DETECTED  NONE DETECTED    Amphetamines  NONE DETECTED  NONE DETECTED    Tetrahydrocannabinol POSITIVE (*) NONE DETECTED    Barbiturates NONE DETECTED  NONE DETECTED        MDM  Labs. Act team called. Nursing notes reviewed, prior chart reviewed.   Labs pending, signed out to Dr Hyacinth Meeker to check labs, follow up with ACT assessment and placement into psych facility.  telepsych ordered re dispo and med recommendations pending placement.         Suzi Roots, MD 11/04/11 [phone]

## 2011-11-04 NOTE — ED Notes (Signed)
Sitter at bedside and watching patient

## 2011-11-04 NOTE — Discharge Instructions (Signed)
Suicidal Feelings, How to Help Yourself  Everyone feels sad or unhappy at times, but depressing thoughts and feelings of hopelessness can lead to thoughts of suicide. It can seem as if life is too tough to handle. It is as if the mountain is just too high and your climbing skills are not great enough. At that moment these dark thoughts and feelings may seem overwhelming and never ending. It is important to remember these feelings are temporary! They will go away. If you feel as though you have reached the point where suicide is the only answer, it is time to let someone know immediately. This is the first step to feeling better. The following steps will move you to safer ground and lead you in a positive direction out of depression.  HOW TO COPE AND PREVENT SUICIDE   Let family, friends, teachers and/or counselors know. Get help. Try not to isolate yourself from those who care about you. Even though you may not feel sociable or think that you are not good company, talk with someone everyday. It is best if it is face to face. Remember, they will want to help you.   Eat a regularly spaced and well-balanced diet, and get plenty of rest.   Avoid alcohol and drugs because they will only make you feel worse and may also lower your inhibitions. Remove them from the home. If you are thinking of taking an overdose of your prescribed medications, give your medicines to someone who can give them to you one day at a time. If you are on antidepressants, let your caregiver know of your feelings so he or she can provide a safer medication, if that is a concern.   Remove weapons or poisons from your home.   Try to stick to routines. That may mean just walking the dog or feeding the cat. Follow a schedule and remind yourself that you have to keep that schedule every day. Play with your pets. If it is possible, and you do not have a pet, get one. They give you a sense of well-being, lower your blood pressure and make your heart  feel good. They need you, and we all want to be needed.   Set some realistic goals and achieve them. Make a list and cross things off as you go. Accomplishments give a sense of worth. Wait until you are feeling better before doing things you find difficult or unpleasant to do.   If you are able, try to start exercising. Even half-hour periods of exercise each day will make you feel better. Getting out in the sun or into nature helps you recover from depression faster. If you have a favorite place to walk, take advantage of that.   Increase safe activities that have always given you pleasure. This may include playing your favorite music, reading a good book, painting a picture or playing your favorite instrument. Do whatever takes your mind off your depression and puts a smile on your face.   Keep your living space well lit with windows open, and let the sun shine in. Bright light definitely treats depression, not just people with the seasonal affective disorders (SAD).  Above all else remember, depression is temporary. It will go away. Do not contemplate suicide. Death as a permanent solution is not the answer. Suicide will take away the beautiful rest of life, and do lifelong harm to those around you who love you. Help is available.  National Suicide Help Lines with 24 hour help   Released: 01/14/2003 Document Revised: 06/29/2011 Document Reviewed: 06/04/2007 [facility] Patient Information 2012 [employer], [place]. RESOURCE GUIDE  Dental Problems  Patients with Medicaid: [facility]                     [phone] [address]                                           Phone:  [phone]                                                  If unable to pay or uninsured, contact:  [facility] or [facility] to become qualified for the adult dental clinic.  Chronic Pain Problems Contact [facility]   [phone] Patients need to be referred by their primary care doctor.  Insufficient Money for Medicine Contact [employer]:  call "211" or [facility] 802-788-7956.  No Primary Care Doctor Call [facility]  608-177-6721 Other agencies that provide inexpensive medical care    [facility]  [phone]    [facility]  [phone]    [facility]  613-207-7046    [facility]  [phone]    [facility]  2094826679    [facility]  [phone]  Substance Abuse Resources [facility] and Drug Services  (430) 762-8282 [facility] (334)677-7495 [facility] [place] [facility] [place] (602)711-2404 Floydene Flock (256)792-6026 Residential & Outpatient Substance Abuse Program  [phone]  Psychological Services [facility]  [phone] [facility]  9568220702 [facility]   [phone] (emergency services [phone])  Abuse/Neglect [facility] Child Abuse Hotline (336)382-4875 [facility] Child Abuse Hotline (229) 800-8855 (After Hours)  Emergency Shelter [facility] 208-196-7718  [facility] [place] [facility] [phone] [facility] (819)494-4809  MRSA Hotline #:   (785) 681-4069    [facility] Resources  [facility]  United [employer]                           [facility] Dept. [address] [place]                     [address]         [address]  Blondell Reveal Phone:  [phone]                                  Phone:  [phone]                   Phone:  [phone]  [facility]  Mental Health Phone:  [phone]  [facility] Child Abuse Hotline 269 725 5259 9731123666 (After Hours)

## 2011-11-04 NOTE — ED Notes (Signed)
telepsyche completed for patient.

## 2011-11-04 NOTE — ED Notes (Signed)
Patient discharged to home. Dc instructions given. No concerns voiced. Left unit ambulating off unit to checkout. Left in good condition. Prescription x 1 given to patient for celexa.

## 2011-11-04 NOTE — ED Notes (Signed)
Breakfast tray given to patient. Pt sitting up in bed eating breakfast.

## 2011-11-04 NOTE — ED Notes (Signed)
Lunch tray delivered. Pt sitting up in bed eating lunch.

## 2014-01-18 ENCOUNTER — Emergency Department (HOSPITAL_COMMUNITY): Payer: Medicaid Other

## 2014-01-18 ENCOUNTER — Encounter (HOSPITAL_COMMUNITY): Payer: Self-pay | Admitting: Emergency Medicine

## 2014-01-18 ENCOUNTER — Emergency Department (HOSPITAL_COMMUNITY)
Admission: EM | Admit: 2014-01-18 | Discharge: 2014-01-18 | Disposition: A | Discharge status: Home / Self Care | Payer: Medicaid Other | Attending: Emergency Medicine | Admitting: Emergency Medicine

## 2014-01-18 DIAGNOSIS — R109 Unspecified abdominal pain: Secondary | ICD-10-CM | POA: Diagnosis present

## 2014-01-18 DIAGNOSIS — Z79899 Other long term (current) drug therapy: Secondary | ICD-10-CM | POA: Insufficient documentation

## 2014-01-18 DIAGNOSIS — J45909 Unspecified asthma, uncomplicated: Secondary | ICD-10-CM | POA: Insufficient documentation

## 2014-01-18 DIAGNOSIS — R319 Hematuria, unspecified: Secondary | ICD-10-CM | POA: Insufficient documentation

## 2014-01-18 DIAGNOSIS — R1011 Right upper quadrant pain: Secondary | ICD-10-CM

## 2014-01-18 DIAGNOSIS — K56609 Unspecified intestinal obstruction, unspecified as to partial versus complete obstruction: Secondary | ICD-10-CM | POA: Insufficient documentation

## 2014-01-18 DIAGNOSIS — R1031 Right lower quadrant pain: Secondary | ICD-10-CM

## 2014-01-18 DIAGNOSIS — R1033 Periumbilical pain: Secondary | ICD-10-CM

## 2014-01-18 DIAGNOSIS — Z87448 Personal history of other diseases of urinary system: Secondary | ICD-10-CM | POA: Insufficient documentation

## 2014-01-18 DIAGNOSIS — F17200 Nicotine dependence, unspecified, uncomplicated: Secondary | ICD-10-CM | POA: Insufficient documentation

## 2014-01-18 DIAGNOSIS — K56600 Partial intestinal obstruction, unspecified as to cause: Secondary | ICD-10-CM

## 2014-01-18 LAB — URINE MICROSCOPIC-ADD ON

## 2014-01-18 LAB — CBC WITH DIFFERENTIAL/PLATELET
BASOS PCT: 0 % (ref 0–1)
Basophils Absolute: 0 10*3/uL (ref 0.0–0.1)
EOS ABS: 0.6 10*3/uL (ref 0.0–0.7)
EOS PCT: 6 % — AB (ref 0–5)
HCT: 43.7 % (ref 39.0–52.0)
Hemoglobin: 15 g/dL (ref 13.0–17.0)
LYMPHS ABS: 2.7 10*3/uL (ref 0.7–4.0)
Lymphocytes Relative: 27 % (ref 12–46)
MCH: 30.8 pg (ref 26.0–34.0)
MCHC: 34.3 g/dL (ref 30.0–36.0)
MCV: 89.7 fL (ref 78.0–100.0)
Monocytes Absolute: 0.6 10*3/uL (ref 0.1–1.0)
Monocytes Relative: 6 % (ref 3–12)
Neutro Abs: 6.1 10*3/uL (ref 1.7–7.7)
Neutrophils Relative %: 61 % (ref 43–77)
PLATELETS: 188 10*3/uL (ref 150–400)
RBC: 4.87 MIL/uL (ref 4.22–5.81)
RDW: 13.5 % (ref 11.5–15.5)
WBC: 10 10*3/uL (ref 4.0–10.5)

## 2014-01-18 LAB — URINALYSIS, ROUTINE W REFLEX MICROSCOPIC
BILIRUBIN URINE: NEGATIVE
Glucose, UA: NEGATIVE mg/dL
HGB URINE DIPSTICK: NEGATIVE
KETONES UR: NEGATIVE mg/dL
NITRITE: NEGATIVE
PROTEIN: NEGATIVE mg/dL
SPECIFIC GRAVITY, URINE: 1.02 (ref 1.005–1.030)
UROBILINOGEN UA: 1 mg/dL (ref 0.0–1.0)
pH: 6.5 (ref 5.0–8.0)

## 2014-01-18 LAB — COMPREHENSIVE METABOLIC PANEL
ALT: 23 U/L (ref 0–53)
AST: 31 U/L (ref 0–37)
Albumin: 3.5 g/dL (ref 3.5–5.2)
Alkaline Phosphatase: 95 U/L (ref 39–117)
BUN: 5 mg/dL — ABNORMAL LOW (ref 6–23)
CALCIUM: 8.3 mg/dL — AB (ref 8.4–10.5)
CO2: 22 meq/L (ref 19–32)
CREATININE: 1.04 mg/dL (ref 0.50–1.35)
Chloride: 104 mEq/L (ref 96–112)
GLUCOSE: 87 mg/dL (ref 70–99)
Potassium: 3.9 mEq/L (ref 3.7–5.3)
Sodium: 140 mEq/L (ref 137–147)
TOTAL PROTEIN: 6.4 g/dL (ref 6.0–8.3)
Total Bilirubin: 0.4 mg/dL (ref 0.3–1.2)

## 2014-01-18 LAB — LIPASE, BLOOD: LIPASE: 27 U/L (ref 11–59)

## 2014-01-18 MED ORDER — ONDANSETRON HCL 4 MG/2ML IJ SOLN
4.0000 mg | Freq: Once | INTRAMUSCULAR | Status: AC
  Administered 2014-01-18: 4 mg via INTRAVENOUS
  Filled 2014-01-18: qty 2

## 2014-01-18 MED ORDER — ONDANSETRON HCL 4 MG PO TABS: 4.0000 mg | ORAL_TABLET | Freq: Three times a day (TID) | ORAL | Status: DC | PRN

## 2014-01-18 MED ORDER — HYDROMORPHONE HCL PF 1 MG/ML IJ SOLN
1.0000 mg | Freq: Once | INTRAMUSCULAR | Status: AC
  Administered 2014-01-18: 1 mg via INTRAVENOUS
  Filled 2014-01-18: qty 1

## 2014-01-18 MED ORDER — IOHEXOL 300 MG/ML  SOLN
100.0000 mL | Freq: Once | INTRAMUSCULAR | Status: AC | PRN
  Administered 2014-01-18: 100 mL via INTRAVENOUS

## 2014-01-18 MED ORDER — HYDROCODONE-ACETAMINOPHEN 5-325 MG PO TABS: 1.0000 | ORAL_TABLET | ORAL | Status: DC | PRN

## 2014-01-18 MED ORDER — IOHEXOL 300 MG/ML  SOLN
25.0000 mL | Freq: Once | INTRAMUSCULAR | Status: AC | PRN
  Administered 2014-01-18: 25 mL via ORAL

## 2014-01-18 NOTE — ED Provider Notes (Signed)
Care of pt taken over from Dr. Manus Gunningancour at about 4:39 PM Please refer to their note for further history and plan In brief, patient is a 38 y.o. y/o male who p/w abd pain. Initially RLQ. Concern for appy. CT negative. Later more RUQ pain. Obtaining RUQ US.  Painless hematuria 2 days ago resolved. Feels different from prior stones.   Plan is to f/u ultrasound.   US negative.  Radiology changed report to partial bowel obstruction.  Plan to admit. Medicine consulted.  Patient now declining admission.  Believe this to be reasonable as pain controlled, normal po, normal BM's, passing gas.  F/u pcp Strict return precautions.       1. Abdominal pain, unspecified abdominal location   2. Partial small bowel obstruction   3. Periumbilical abdominal pain   4. Right upper quadrant pain   5. Right lower quadrant abdominal pain      Stevie Kernyan McLennan, MD 01/19/14 [phone]

## 2014-01-18 NOTE — ED Notes (Signed)
Pt reporting extreme pain and tenderness to RLQ. States "this does not feel like a kidney stone."

## 2014-01-18 NOTE — ED Notes (Signed)
CT aware that patient has finished contrast.

## 2014-01-18 NOTE — ED Notes (Signed)
Into room, introduced self to pt.  Pt states he is feeling fine; reports Dr Julian ReilGardner told him he could go home.  Will await orders.
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
## 2014-01-18 NOTE — ED Notes (Signed)
Pt comfortable with discharge and follow up instructions. Pt declines wheelchair, escorted to waiting area. 

## 2014-01-18 NOTE — ED Notes (Signed)
ED resident at bedside.

## 2014-01-18 NOTE — ED Provider Notes (Signed)
Medical screening examination/treatment/procedure(s) were conducted as a shared visit with non-physician practitioner(s) and myself.  I personally evaluated the patient during the encounter.  RLQ pain since last night.  Worse with movment. No n/v/d/f/c. No urinary symptoms or testicular pain. Not like previous kidney stones. TTP RLQ and periumbilical with guarding.  Well healed midline scar. R/o appendicitis.   EKG Interpretation None       Glynn OctaveStephen Mychelle Kendra, MD 01/18/14 [phone]

## 2014-01-18 NOTE — ED Notes (Signed)
Per pt sts this am started having RLQ pain and hematuria. sts hx of kidney stone. sts the pain comes and goes and it feels like a ball in his groin.

## 2014-01-18 NOTE — Discharge Instructions (Signed)
Call for a follow up appointment with a Family or Primary Care Provider.  Return to the Emergency Department if symptoms worsen.   Take medication as prescribed.     PLEASE RETURN TO EMERGENCY DEPARTMENT FOR VOMITING, WORSENING PAIN, NOT ABLE TO HAVE NORMAL BOWEL MOVEMENTS OR PASSING GAS, NOT ABLE TO KEEP FOOD DOWN, OR ANY OTHER CONCERNING SYMPTOMS.      Emergency Department Resource Guide 1) Find a Doctor and Pay Out of Pocket Although you won't have to find out who is covered by your insurance plan, it is a good idea to ask around and get recommendations. You will then need to call the office and see if the doctor you have chosen will accept you as a new patient and what types of options they offer for patients who are self-pay. Some doctors offer discounts or will set up payment plans for their patients who do not have insurance, but you will need to ask so you aren't surprised when you get to your appointment.  2) Contact Your Local Health Department Not all health departments have doctors that can see patients for sick visits, but many do, so it is worth a call to see if yours does. If you don't know where your local health department is, you can check in your phone book. The CDC also has a tool to help you locate your state's health department, and many state websites also have listings of all of their local health departments.  3) Find a Walk-in Clinic If your illness is not likely to be very severe or complicated, you may want to try a walk in clinic. These are popping up all over the country in pharmacies, drugstores, and shopping centers. They're usually staffed by nurse practitioners or physician assistants that have been trained to treat common illnesses and complaints. They're usually fairly quick and inexpensive. However, if you have serious medical issues or chronic medical problems, these are probably not your best option.  No Primary Care Doctor: - Call Health Connect at   [phone] - they can help you locate a primary care doctor that  accepts your insurance, provides certain services, etc. - Physician Referral Service- [phone]  Chronic Pain Problems: Organization         Address  Phone   Notes  [facility]  [phone] Patients need to be referred by their primary care doctor.   Medication Assistance: Organization         Address  Phone   Notes  [facility] Medication [facility] [address] [place] [place], [place] [address] [phone] --Must be a resident of [facility] -- Must have NO insurance coverage whatsoever (no Medicaid/ Medicare, etc.) -- The pt. MUST have a primary care doctor that directs their care regularly and follows them in the community   [facility]  [phone]   [employer]  [phone]    Agencies that provide inexpensive medical care: Organization         Address  Phone   Notes  [facility]  [phone]   [facility]    [phone]   [facility] [address] [place], [place] [address] [phone]   [facility] [address] [place], [place] [phone]   Planned Parenthood    [phone]   [facility]    [phone]   [facility]  [address]  Mardene Speak Phone:  [phone], Fax:  [phone] Hours of Operation:  9 am - 6 pm, M-F.  Also accepts Medicaid/Medicare and self-pay.  [facility] for [place], [address], [place] Phone: [phone], Fax: [phone]. Hours of Operation:  8:30 am - 5:30 pm, M-F.  Also accepts Medicaid and self-pay.  [facility] [address] [place] Phone: [phone]   [place], [place], [place] [phone], Ext. [phone] Mondays & Thursdays: 7-9 AM.  First 15 patients are seen on a first come, first serve basis.     [place] Providers:  Organization         Address  Phone   Notes  [facility] [address], [place] [phone] Also accepts self-pay patients.  [facility] [id number] [place], [place]  [phone]   [place], [address], [place] [phone]   [facility] [address], [place] [phone]   Lucianne Lei [address], [place]   [phone] Only accepts [place] Access [place] patients after they have their name applied to their card.   Self-Pay (no insurance) in [facility]:  Organization         Address  Phone   Notes  Sickle Cell Patients, [facility] [place] [phone]   [facility] [place] [phone]   [facility]  [place], [place], [place] [phone]   [facility]/Dr. Osei-Bonsu  [address] [place] or [address], [place] [phone] Phone number for both [place] and [place] locations is the same.  [facility] [address], [place] [phone]   [facility] [address] [place] or [address] [phone]   [facility] [address], [place] [phone], phone; [phone], fax Sees patients 1st and 3rd Saturday of every month.  Must not qualify for public or private insurance (i.e. Medicaid, Medicare, [place] Health Choice, Veterans' Benefits)  Household income should be no more than 200% of the poverty level The clinic cannot treat you if you are pregnant or think you are pregnant  Sexually transmitted diseases are not treated at the clinic.    Dental Care: Organization         Address  Phone  Notes  [facility]  Department of [facility] [place] [phone] Accepts children up to age 58 who are enrolled in [place] or [place]; pregnant women with a Medicaid card; and children who have applied for Medicaid or [place] Health Choice, but were declined, whose parents can pay a reduced fee at time of service.  [facility] Department of [facility]  [address], [place] [phone] Accepts children up to age 34 who are enrolled in [place] or [place]; pregnant women with a Medicaid card; and children who have applied for Medicaid or [place] Health Choice, but were declined, whose parents can pay a reduced fee at time of service.  [employer] Adult Dental Access PROGRAM  [place] [phone] Patients are seen  by appointment only. Walk-ins are not accepted. [place] will see patients 43 years of age and older. Monday - Tuesday (8am-5pm) Most Wednesdays (8:30-5pm) $30 per visit, cash only  [facility] Adult Dental Access PROGRAM  [address], [facility] [phone] Patients are seen by appointment only. Walk-ins are not accepted. [place] will see patients 22 years of age and older. One Wednesday Evening (Monthly: Volunteer Based).  $30 per visit, cash only  [place]  [phone] for adults; Children under age 43, call Graduate Pediatric Dentistry at [phone]. Children aged 30-14, please call [phone] to request a pediatric application.  Dental services are provided in all areas of dental care including fillings, crowns and bridges, complete and partial dentures, implants, gum treatment, root canals, and extractions. Preventive care is also provided. Treatment is provided to both adults and children. Patients are selected via a lottery and there is often a waiting list.   [facility] [address], [place]  [phone]  [email or web address]   [facility] [address] [place], [place] [phone], Ext. [phone] Second and Fourth Thursday of each month, opens at 6:30 AM; Clinic ends at 9 AM.  Patients are seen on a first-come first-served basis, and a limited number are seen during each clinic.   [facility]  [address] Hillard Danker [place], [place] [phone]   Eligibility Requirements You must have lived in [place], [place], or [place] counties for at least the last three months.   You cannot be eligible for state or federal sponsored [employer], including [employer], [place], or [employer].   You generally cannot be eligible for healthcare insurance through your employer.    How to apply: Eligibility screenings are held every Tuesday and Wednesday afternoon from 1:00 pm until 4:00 pm. You do not need an appointment for the interview!  [facility] [address], [place], [place]   [place]  [place] Department  [place]  [phone]    Behavioral Health Resources in the Community: Intensive Outpatient Programs Organization         Address  Phone  Notes  [place]. [address], [place], [place] [phone]   [facility] Outpatient [address] [place], [place]   [facility] [address] [place], [place]   [place] [address]  [place], [place] or [phone]   Substance Abuse Resources Organization         Address  Phone  Notes  [facility] Drug Services  [phone]   [place]  [phone]   [facility] [place]   Chinita Pester  [phone]   Residential & Outpatient Substance Abuse Program  [phone]   Psychological Services Organization          Address  Phone  Notes  [facility]  [place]  [phone]   [place] [address] [place] or [phone]    Mobile Crisis Teams Organization         Address  Phone  Notes  [facility], Mobile Crisis Care Unit  [phone]   [facility]  [address] [place], [place]   [facility] [address] [place]  [phone]    Self-Help/Support Groups Organization         Address  Phone             Notes  [facility] [place] - variety of support groups  [place] Call for more information  Narcotics Anonymous (NA), [facility] [address], [employer] [place]  2 meetings at this location   Special educational needs teacher         Address  Phone  Notes  [facility]idential Treatment [place],    [place]  [phone]   [facility] [place]  [address], [place] [phone], [place], [place]   [place], [place] [phone] Admissions: 8am-3pm M-F  Incentives Substance [place] [address]    [place], [place] [phone]   [facility] [address] [place], [place], [place]   [facility] [address]  [place], [place]   [facility] - Intensive Outpatient [address] Kristeen [address], [place], [place]   [facility] ([place].,  [place], [place] [phone] or [phone]   [facility] ([facility]) [address] [place], [place] Accepts Medicaid  [facility] [place] [address]  [place] [phone] Substance Abuse/Addiction Treatment   [facility] Organization         Address  Phone  Notes  [facility]  [phone]   Domenic Schwab, PhD [address] Arlis Porta [place], [place]   [phone] or [phone]   [place], [place] [phone]   [facility] [address] [place], [place] [phone] Insurance/Medicaid/sponsorship through [facility] and Families [address], [address] [place]                                    [place], [place] [phone] [place] [address], [place] [phone]    Dr. Adele Schilder  [phone]   [facility] Dept. 1) [address], [place] 2) [place] 3)  [place] [address], [place] [phone]  [phone]   [place] [phone] or [phone] (After Hours)       Abdominal Pain Many things can cause abdominal pain. Usually, abdominal pain is not caused by a disease and will improve without treatment. It can often be observed and treated at home. Your health care provider will do a physical exam and possibly order blood tests and X-rays to help determine the seriousness of your pain. However, in many cases, more time must pass before a clear cause of the pain can be found. Before that point, your health care provider may not know if you need more testing or further treatment. HOME CARE INSTRUCTIONS  Monitor your abdominal pain for any changes. The following actions may help to alleviate any discomfort you are experiencing:  Only take over-the-counter or prescription medicines as directed by your health care provider.  Do not take laxatives unless directed to do so by your health care provider.  Try a clear liquid diet (broth, tea, or water) as directed by your health care provider. Slowly move to  a bland diet as tolerated. SEEK MEDICAL CARE IF:  You have unexplained abdominal pain.  You have abdominal pain associated with nausea or diarrhea.  You have pain when you urinate or  have a bowel movement.  You experience abdominal pain that wakes you in the night.  You have abdominal pain that is worsened or improved by eating food.  You have abdominal pain that is worsened with eating fatty foods.  You have a fever. SEEK IMMEDIATE MEDICAL CARE IF:   Your pain does not go away within 2 hours.  You keep throwing up (vomiting).  Your pain is felt only in portions of the abdomen, such as the right side or the left lower portion of the abdomen.  You pass bloody or black tarry stools. MAKE SURE YOU:  Understand these instructions.   Will watch your condition.   Will get help right away if you are not doing well or get worse.  Document Released: 04/19/2005 Document Revised: 07/15/2013 Document Reviewed: 03/19/2013 [facility] Patient Information 2015 [place]Care, [place]. This information is not intended to replace advice given to you by your health care provider. Make sure you discuss any questions you have with your health care provider.  Intestinal Obstruction An intestinal obstruction is a blockage of the intestine. It can be caused by a physical blockage or by a problem of abnormal function of the intestine.  CAUSES   Adhesions from previous surgeries.  Cancer or tumor.  A hernia, which is a condition in which a portion of the bowel bulges out through an opening or weakness in the abdomen. This sometimes squeezes the bowel.  A swallowed object.  Blockage (impaction) with worms is common in third world countries.  A twisting of the bowel or telescoping of a portion of the bowel into another portion (intussusceptions).  Anything that stops food from going through from the stomach to the anus. SYMPTOMS  Symptoms of bowel obstruction may include abdominal bloating, nausea, vomiting, explosive diarrhea, or explosive stool. You may not be able to hear your normal bowel sounds (such as "growling in your stomach"). You may also stop having bowel movements or  passing gas. DIAGNOSIS  Usually this condition is diagnosed with a history and an examination. Often, lab studies (blood work) and X-rays may be used to find the cause. TREATMENT  The main treatment for this condition is to rest the intestine. Often, the obstruction may relieve itself and allow the intestine to start working again. Think of the intestine like a balloon that is blown up (filled with trapped food and water that has squeezed into a hole or area that it cannot get through).   If the obstruction is complete, a nasogastric (NG) tube is passed through the nose and into the stomach. It is then connected to suction to keep the stomach emptied out. This also helps treat the nausea and vomiting.  If there is an imbalance in the electrolytes, they are corrected with intravenous fluids. These fluids have the proper chemicals in them to correct the problem.  If the reason for the blockage does not get better with conservative (nonsurgical) treatment, surgery may be necessary. Sometimes, surgery is done immediately if your surgeon knows that the problem is not going to get better with conservative treatment. PROGNOSIS  Depending on what the problem is, most of these problems can be treated by your caregivers with good results. Your caregiver will discuss with you the best course of action to take. FOLLOWING SURGERY Seek immediate medical  attention if you have:  Increasing abdominal pain, repeated vomiting, dehydration, or fainting.  Severe weakness, chest pain, or back pain.  Blood in your vomit or stool.  [place] stool. Document Released: 09/30/2003 Document Revised: 11/04/2012 Document Reviewed: 02/28/2008 [facility] Patient Information 2015 [place]Care, [place]LL[place]. This information is not intended to replace advice given to you by your health care provider. Make sure you discuss any questions you have with your health care provider.
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
## 2014-01-21 NOTE — ED Provider Notes (Signed)
I saw and evaluated the patient, reviewed the resident's note and I agree with the findings and plan.   EKG Interpretation None        Rolan BuccoMelanie Meisha Salone, MD 01/21/14 2133

## 2014-06-29 ENCOUNTER — Encounter (HOSPITAL_COMMUNITY): Payer: Self-pay | Admitting: Family Medicine

## 2014-06-29 ENCOUNTER — Emergency Department (HOSPITAL_COMMUNITY)
Admission: EM | Admit: 2014-06-29 | Discharge: 2014-06-29 | Disposition: A | Discharge status: Home / Self Care | Payer: Medicaid Other | Attending: Emergency Medicine | Admitting: Emergency Medicine

## 2014-06-29 DIAGNOSIS — R42 Dizziness and giddiness: Secondary | ICD-10-CM | POA: Diagnosis not present

## 2014-06-29 DIAGNOSIS — J45909 Unspecified asthma, uncomplicated: Secondary | ICD-10-CM | POA: Diagnosis not present

## 2014-06-29 DIAGNOSIS — Z720 Tobacco use: Secondary | ICD-10-CM | POA: Diagnosis not present

## 2014-06-29 DIAGNOSIS — R112 Nausea with vomiting, unspecified: Secondary | ICD-10-CM | POA: Diagnosis not present

## 2014-06-29 DIAGNOSIS — R0981 Nasal congestion: Secondary | ICD-10-CM | POA: Diagnosis present

## 2014-06-29 DIAGNOSIS — Z87448 Personal history of other diseases of urinary system: Secondary | ICD-10-CM | POA: Insufficient documentation

## 2014-06-29 DIAGNOSIS — J0100 Acute maxillary sinusitis, unspecified: Secondary | ICD-10-CM | POA: Diagnosis not present

## 2014-06-29 MED ORDER — PSEUDOEPHEDRINE HCL 30 MG/5ML PO SYRP: 30.0000 mg | ORAL_SOLUTION | Freq: Four times a day (QID) | ORAL | Status: DC | PRN

## 2014-06-29 MED ORDER — AMOXICILLIN 500 MG PO CAPS: 500.0000 mg | ORAL_CAPSULE | Freq: Two times a day (BID) | ORAL | Status: DC

## 2014-06-29 MED ORDER — AMOXICILLIN 500 MG PO CAPS
500.0000 mg | ORAL_CAPSULE | Freq: Once | ORAL | Status: AC
  Administered 2014-06-29: 500 mg via ORAL
  Filled 2014-06-29: qty 1

## 2014-06-29 NOTE — ED Notes (Signed)
Per pt sts 5 days of sinus congestion. Taking mucinex without relief.

## 2014-06-29 NOTE — ED Notes (Signed)
Pts vital signs updated pt awaiting discharge paperwork at bedside.  

## 2014-06-29 NOTE — ED Provider Notes (Signed)
CSN: [id number]     Arrival date & time 06/29/14  1335 History  This chart was scribed for non-physician practitioner, Emilia BeckKaitlyn Jaeshawn Silvio, PA-C, working with Gerhard Munchobert Lockwood, MD by Charline BillsEssence Howell, ED Scribe. This patient was seen in room TR08C/TR08C and the patient's care was started at 2:33 PM.   Chief Complaint  Patient presents with  . Nasal Congestion   The history is provided by the patient. No language interpreter was used.   HPI Comments: [name] is a 38 y.o. male, with a h/o asthma and renal disorder, who presents to the Emergency Department complaining of persistent congestion for 5 days. He report associated cough that is worse at night, sinus pressure, nausea, intermittent dizziness. He denies fever. Pt has been treating with Mucinex without relief.   Past Medical History  Diagnosis Date  . Asthma   . Renal disorder    History reviewed. No pertinent past surgical history. History reviewed. No pertinent family history. History  Substance Use Topics  . Smoking status: Current Every Day Smoker  . Smokeless tobacco: Not on file  . Alcohol Use: Yes    Review of Systems  Constitutional: Negative for fever.  HENT: Positive for congestion and sinus pressure.   Respiratory: Positive for cough.   Gastrointestinal: Positive for nausea.  Neurological: Positive for dizziness.  All other systems reviewed and are negative.  Allergies  Review of patient's allergies indicates no known allergies.  Home Medications   Prior to Admission medications   Medication Sig Start Date End Date Taking? Authorizing Provider  albuterol (PROVENTIL HFA;VENTOLIN HFA) 108 (90 BASE) MCG/ACT inhaler Inhale 2 puffs into the lungs every 6 (six) hours as needed. For shortness of breath    Historical Provider, MD  albuterol (PROVENTIL) (2.5 MG/3ML) 0.083% nebulizer solution Take 2.5 mg by nebulization every 6 (six) hours as needed. As needed for asthma.    Historical Provider, MD   HYDROcodone-acetaminophen (NORCO/VICODIN) 5-325 MG per tablet Take 1 tablet by mouth every 4 (four) hours as needed for moderate pain or severe pain. 01/18/14   Mellody DrownLauren Parker, PA-C  ondansetron (ZOFRAN) 4 MG tablet Take 1 tablet (4 mg total) by mouth every 8 (eight) hours as needed for nausea or vomiting. 01/18/14   Stevie Kernyan McLennan, MD   Triage Vitals: BP 121/78 mmHg  Pulse 79  Temp(Src) 98.3 F (36.8 C)  Resp 18  SpO2 98% Physical Exam  Constitutional: He is oriented to person, place, and time. He appears well-developed and well-nourished. No distress.  HENT:  Head: Normocephalic and atraumatic.  Nose: Right sinus exhibits maxillary sinus tenderness. Left sinus exhibits maxillary sinus tenderness.  Mouth/Throat: Oropharynx is clear and moist and mucous membranes are normal.  Eyes: Conjunctivae and EOM are normal.  Mild periorbital swelling bilaterally.   Neck: Neck supple. No tracheal deviation present.  Cardiovascular: Normal rate.   Pulmonary/Chest: Effort normal. No respiratory distress.  Musculoskeletal: Normal range of motion.  Neurological: He is alert and oriented to person, place, and time.  Skin: Skin is warm and dry. He is not diaphoretic.  Psychiatric: He has a normal mood and affect. His behavior is normal.  Nursing note and vitals reviewed.  ED Course  Procedures (including critical care time) DIAGNOSTIC STUDIES: Oxygen Saturation is 98% on RA, normal by my interpretation.    COORDINATION OF CARE: 2:35 PM-Discussed treatment plan which includes Amoxil and Sudafed with pt at bedside and pt agreed to plan.   Labs Review Labs Reviewed - No data to display  Imaging Review No results found.   EKG Interpretation None      MDM   Final diagnoses:  Acute maxillary sinusitis, recurrence not specified    2:39 PM Patient likely has sinusitis and will be treated with amoxicillin and sudafed. Vitals stable and patient afebrile. No further evaluation needed at this  time.   I personally performed the services described in this documentation, which was scribed in my presence. The recorded information has been reviewed and is accurate.    Emilia BeckKaitlyn Adalynne Steffensmeier, PA-C 06/29/14 1440  Gerhard Munchobert Lockwood, MD 06/29/14 [phone]

## 2014-06-29 NOTE — Discharge Instructions (Signed)
Take amoxicillin as directed until gone. Take sudafed as needed for congestion. Refer to attached documents for more information.

## 2014-07-20 ENCOUNTER — Emergency Department (HOSPITAL_COMMUNITY)
Admission: EM | Admit: 2014-07-20 | Discharge: 2014-07-20 | Disposition: A | Discharge status: Home / Self Care | Payer: Medicaid Other | Attending: Emergency Medicine | Admitting: Emergency Medicine

## 2014-07-20 ENCOUNTER — Encounter (HOSPITAL_COMMUNITY): Payer: Self-pay | Admitting: Emergency Medicine

## 2014-07-20 DIAGNOSIS — S61210A Laceration without foreign body of right index finger without damage to nail, initial encounter: Secondary | ICD-10-CM | POA: Insufficient documentation

## 2014-07-20 DIAGNOSIS — Y93G3 Activity, cooking and baking: Secondary | ICD-10-CM | POA: Diagnosis not present

## 2014-07-20 DIAGNOSIS — Z79899 Other long term (current) drug therapy: Secondary | ICD-10-CM | POA: Insufficient documentation

## 2014-07-20 DIAGNOSIS — Y929 Unspecified place or not applicable: Secondary | ICD-10-CM | POA: Diagnosis not present

## 2014-07-20 DIAGNOSIS — J45909 Unspecified asthma, uncomplicated: Secondary | ICD-10-CM | POA: Insufficient documentation

## 2014-07-20 DIAGNOSIS — W260XXA Contact with knife, initial encounter: Secondary | ICD-10-CM | POA: Insufficient documentation

## 2014-07-20 DIAGNOSIS — Z720 Tobacco use: Secondary | ICD-10-CM | POA: Insufficient documentation

## 2014-07-20 DIAGNOSIS — Y998 Other external cause status: Secondary | ICD-10-CM | POA: Diagnosis not present

## 2014-07-20 DIAGNOSIS — Z87448 Personal history of other diseases of urinary system: Secondary | ICD-10-CM | POA: Diagnosis not present

## 2014-07-20 MED ORDER — HYDROCODONE-ACETAMINOPHEN 5-325 MG PO TABS: 1.0000 | ORAL_TABLET | Freq: Four times a day (QID) | ORAL | Status: DC | PRN

## 2014-07-20 MED ORDER — NAPROXEN 500 MG PO TABS: 500.0000 mg | ORAL_TABLET | Freq: Two times a day (BID) | ORAL | Status: DC | PRN
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
## 2014-07-20 NOTE — Discharge Instructions (Signed)
Keep wound and clean with mild soap and water. Keep area covered with a bandage, keep bandage dry, and do not submerge in water for 24 hours. AVOID OINTMENTS AND LOTIONS TO THIS AREA. Ice and elevate for additional pain relief and swelling. Alternate between naprosyn and vicodin for additional pain relief but don't drive while taking vicodin. Follow up with your primary care doctor or the [facility] in approximately 2 days for wound recheck and in one week for ongoing carel. Monitor area for signs of infection to include, but not limited to: increasing pain, redness, drainage/pus, or swelling. Return to emergency department for emergent changing or worsening symptoms.    Fingertip Injuries and Amputations Fingertip injuries are common and often get injured because they are last to escape when pulling your hand out of harm's way. You have amputated (cut off) part of your finger. How this turns out depends largely on how much was amputated. If just the tip is amputated, often the end of the finger will grow back and the finger may return to much the same as it was before the injury.  If more of the finger is missing, your caregiver has done the best with the tissue remaining to allow you to keep as much finger as is possible. Your caregiver after checking your injury has tried to leave you with a painless fingertip that has durable, feeling skin. If possible, your caregiver has tried to maintain the finger's length and appearance and preserve its fingernail.  Please read the instructions outlined below and refer to this sheet in the next few weeks. These instructions provide you with general information on caring for yourself. Your caregiver may also give you specific instructions. While your treatment has been done according to the most current medical practices available, unavoidable complications occasionally occur. If you have any problems or questions after discharge, please call your  caregiver. HOME CARE INSTRUCTIONS   You may resume normal diet and activities as directed or allowed.  Keep your hand elevated above the level of your heart. This helps decrease pain and swelling.  Keep ice packs (or a bag of ice wrapped in a towel) on the injured area for 15-20 minutes, 03-04 times per day, for the first two days.  Change dressings if necessary or as directed.  Clean the wound daily or as directed.  Only take over-the-counter or prescription medicines for pain, discomfort, or fever as directed by your caregiver.  Keep appointments as directed. SEEK IMMEDIATE MEDICAL CARE IF:  You develop redness, swelling, numbness or increasing pain in the wound.  There is pus coming from the wound.  You develop an unexplained oral temperature above 102 F (38.9 C) or as your caregiver suggests.  There is a foul (bad) smell coming from the wound or dressing.  There is a breaking open of the wound (edges not staying together) after sutures or staples have been removed. MAKE SURE YOU:   Understand these instructions.  Will watch your condition.  Will get help right away if you are not doing well or get worse. Document Released: 05/31/2005 Document Revised: 10/02/2011 Document Reviewed: 04/29/2008 [facility] Patient Information 2015 [place], [place]. This information is not intended to replace advice given to you by your health care provider. Make sure you discuss any questions you have with your health care provider.  Laceration Care, Adult A laceration is a cut or lesion that goes through all layers of the skin and into the tissue just beneath the  skin. TREATMENT  Some lacerations may not require closure. Some lacerations may not be able to be closed due to an increased risk of infection. It is important to see your caregiver as soon as possible after an injury to minimize the risk of infection and maximize the opportunity for successful closure. If closure is appropriate,  pain medicines may be given, if needed. The wound will be cleaned to help prevent infection. Your caregiver will use stitches (sutures), staples, wound glue (adhesive), or skin adhesive strips to repair the laceration. These tools bring the skin edges together to allow for faster healing and a better cosmetic outcome. However, all wounds will heal with a scar. Once the wound has healed, scarring can be minimized by covering the wound with sunscreen during the day for 1 full year. HOME CARE INSTRUCTIONS  For sutures or staples:  Keep the wound clean and dry.  If you were given a bandage (dressing), you should change it at least once a day. Also, change the dressing if it becomes wet or dirty, or as directed by your caregiver.  Wash the wound with soap and water 2 times a day. Rinse the wound off with water to remove all soap. Pat the wound dry with a clean towel.  After cleaning, apply a thin layer of the antibiotic ointment as recommended by your caregiver. This will help prevent infection and keep the dressing from sticking.  You may shower as usual after the first 24 hours. Do not soak the wound in water until the sutures are removed.  Only take over-the-counter or prescription medicines for pain, discomfort, or fever as directed by your caregiver.  Get your sutures or staples removed as directed by your caregiver. For skin adhesive strips:  Keep the wound clean and dry.  Do not get the skin adhesive strips wet. You may bathe carefully, using caution to keep the wound dry.  If the wound gets wet, pat it dry with a clean towel.  Skin adhesive strips will fall off on their own. You may trim the strips as the wound heals. Do not remove skin adhesive strips that are still stuck to the wound. They will fall off in time. For wound adhesive:  You may briefly wet your wound in the shower or bath. Do not soak or scrub the wound. Do not swim. Avoid periods of heavy perspiration until the skin  adhesive has fallen off on its own. After showering or bathing, gently pat the wound dry with a clean towel.  Do not apply liquid medicine, cream medicine, or ointment medicine to your wound while the skin adhesive is in place. This may loosen the film before your wound is healed.  If a dressing is placed over the wound, be careful not to apply tape directly over the skin adhesive. This may cause the adhesive to be pulled off before the wound is healed.  Avoid prolonged exposure to sunlight or tanning lamps while the skin adhesive is in place. Exposure to ultraviolet light in the first year will darken the scar.  The skin adhesive will usually remain in place for 5 to 10 days, then naturally fall off the skin. Do not pick at the adhesive film. You may need a tetanus shot if:  You cannot remember when you had your last tetanus shot.  You have never had a tetanus shot. If you get a tetanus shot, your arm may swell, get red, and feel warm to the touch. This is common and not  a problem. If you need a tetanus shot and you choose not to have one, there is a rare chance of getting tetanus. Sickness from tetanus can be serious. SEEK MEDICAL CARE IF:   You have redness, swelling, or increasing pain in the wound.  You see a red line that goes away from the wound.  You have yellowish-white fluid (pus) coming from the wound.  You have a fever.  You notice a bad smell coming from the wound or dressing.  Your wound breaks open before or after sutures have been removed.  You notice something coming out of the wound such as wood or glass.  Your wound is on your hand or foot and you cannot move a finger or toe. SEEK IMMEDIATE MEDICAL CARE IF:   Your pain is not controlled with prescribed medicine.  You have severe swelling around the wound causing pain and numbness or a change in color in your arm, hand, leg, or foot.  Your wound splits open and starts bleeding.  You have worsening numbness,  weakness, or loss of function of any joint around or beyond the wound.  You develop painful lumps near the wound or on the skin anywhere on your body. MAKE SURE YOU:   Understand these instructions.  Will watch your condition.  Will get help right away if you are not doing well or get worse. Document Released: 07/10/2005 Document Revised: 10/02/2011 Document Reviewed: 01/03/2011 [facility] Patient Information 2015 [place], [place]. This information is not intended to replace advice given to you by your health care provider. Make sure you discuss any questions you have with your health care provider.  Stitches, Staples, or Skin Adhesive Strips  Stitches (sutures), staples, and skin adhesive strips hold the skin together as it heals. They will usually be in place for 7 days or less. HOME CARE  Wash your hands with soap and water before and after you touch your wound.  Only take medicine as told by your doctor.  Cover your wound only if your doctor told you to. Otherwise, leave it open to air.  Do not get your stitches wet or dirty. If they get dirty, dab them gently with a clean washcloth. Wet the washcloth with soapy water. Do not rub. Pat them dry gently.  Do not put medicine or medicated cream on your stitches unless your doctor told you to.  Do not take out your own stitches or staples. Skin adhesive strips will fall off by themselves.  Do not pick at the wound. Picking can cause an infection.  Do not miss your follow-up appointment.  If you have problems or questions, call your doctor. GET HELP RIGHT AWAY IF:   You have a temperature by mouth above 102 F (38.9 C), not controlled by medicine.  You have chills.  You have redness or pain around your stitches.  There is puffiness (swelling) around your stitches.  You notice fluid (drainage) from your stitches.  There is a bad smell coming from your wound. MAKE SURE YOU:  Understand these instructions.  Will watch your  condition.  Will get help if you are not doing well or get worse. Document Released: 05/07/2009 Document Revised: 10/02/2011 Document Reviewed: 05/07/2009 [facility] Patient Information 2015 [place], [place]. This information is not intended to replace advice given to you by your health care provider. Make sure you discuss any questions you have with your health care provider.  Wound Care Wound care helps prevent pain and infection.  You may need a  tetanus shot if:  You cannot remember when you had your last tetanus shot.  You have never had a tetanus shot.  The injury broke your skin. If you need a tetanus shot and you choose not to have one, you may get tetanus. Sickness from tetanus can be serious. HOME CARE   Only take medicine as told by your doctor.  Clean the wound daily with mild soap and water.  Change any bandages (dressings) as told by your doctor.  Put medicated cream and a bandage on the wound as told by your doctor.  Change the bandage if it gets wet, dirty, or starts to smell.  Take showers. Do not take baths, swim, or do anything that puts your wound under water.  Rest and raise (elevate) the wound until the pain and puffiness (swelling) are better.  Keep all doctor visits as told. GET HELP RIGHT AWAY IF:   Yellowish-white fluid (pus) comes from the wound.  Medicine does not lessen your pain.  There is a red streak going away from the wound.  You have a fever. MAKE SURE YOU:   Understand these instructions.  Will watch your condition.  Will get help right away if you are not doing well or get worse. Document Released: 04/18/2008 Document Revised: 10/02/2011 Document Reviewed: 11/13/2010 [facility] Patient Information 2015 [place]. This information is not intended to replace advice given to you by your health care provider. Make sure you discuss any questions you have with your health care provider.

## 2014-07-20 NOTE — ED Notes (Signed)
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 Flap laceration, bleeding controlled.

## 2015-08-03 ENCOUNTER — Emergency Department (HOSPITAL_COMMUNITY): Payer: Medicaid Other

## 2015-08-03 ENCOUNTER — Emergency Department (HOSPITAL_COMMUNITY)
Admission: EM | Admit: 2015-08-03 | Discharge: 2015-08-03 | Disposition: A | Discharge status: Home / Self Care | Payer: Medicaid Other | Attending: Emergency Medicine | Admitting: Emergency Medicine

## 2015-08-03 ENCOUNTER — Encounter (HOSPITAL_COMMUNITY): Payer: Self-pay | Admitting: Emergency Medicine

## 2015-08-03 DIAGNOSIS — R1031 Right lower quadrant pain: Secondary | ICD-10-CM | POA: Diagnosis not present

## 2015-08-03 DIAGNOSIS — Z9889 Other specified postprocedural states: Secondary | ICD-10-CM | POA: Diagnosis not present

## 2015-08-03 DIAGNOSIS — R1110 Vomiting, unspecified: Secondary | ICD-10-CM | POA: Diagnosis not present

## 2015-08-03 DIAGNOSIS — Z87448 Personal history of other diseases of urinary system: Secondary | ICD-10-CM | POA: Insufficient documentation

## 2015-08-03 DIAGNOSIS — Z87440 Personal history of urinary (tract) infections: Secondary | ICD-10-CM | POA: Diagnosis not present

## 2015-08-03 DIAGNOSIS — J45909 Unspecified asthma, uncomplicated: Secondary | ICD-10-CM | POA: Diagnosis not present

## 2015-08-03 DIAGNOSIS — Z79899 Other long term (current) drug therapy: Secondary | ICD-10-CM | POA: Diagnosis not present

## 2015-08-03 LAB — CBC
HEMATOCRIT: 43.9 % (ref 39.0–52.0)
Hemoglobin: 14.5 g/dL (ref 13.0–17.0)
MCH: 30.1 pg (ref 26.0–34.0)
MCHC: 33 g/dL (ref 30.0–36.0)
MCV: 91.3 fL (ref 78.0–100.0)
Platelets: 252 10*3/uL (ref 150–400)
RBC: 4.81 MIL/uL (ref 4.22–5.81)
RDW: 13.6 % (ref 11.5–15.5)
WBC: 11.4 10*3/uL — ABNORMAL HIGH (ref 4.0–10.5)

## 2015-08-03 LAB — URINALYSIS, ROUTINE W REFLEX MICROSCOPIC
Bilirubin Urine: NEGATIVE
Glucose, UA: NEGATIVE mg/dL
Hgb urine dipstick: NEGATIVE
Ketones, ur: NEGATIVE mg/dL
Nitrite: NEGATIVE
Protein, ur: NEGATIVE mg/dL
Specific Gravity, Urine: 1.025 (ref 1.005–1.030)
pH: 6 (ref 5.0–8.0)

## 2015-08-03 LAB — URINE MICROSCOPIC-ADD ON: RBC / HPF: NONE SEEN RBC/hpf (ref 0–5)

## 2015-08-03 LAB — COMPREHENSIVE METABOLIC PANEL
ALK PHOS: 83 U/L (ref 38–126)
ALT: 23 U/L (ref 17–63)
AST: 21 U/L (ref 15–41)
Albumin: 3.5 g/dL (ref 3.5–5.0)
Anion gap: 10 (ref 5–15)
BUN: 12 mg/dL (ref 6–20)
CALCIUM: 8.7 mg/dL — AB (ref 8.9–10.3)
CO2: 25 mmol/L (ref 22–32)
CREATININE: 1.2 mg/dL (ref 0.61–1.24)
Chloride: 105 mmol/L (ref 101–111)
GFR calc Af Amer: >60 mL/min (ref >60)
GLUCOSE: 120 mg/dL — AB (ref 65–99)
POTASSIUM: 3.7 mmol/L (ref 3.5–5.1)
Sodium: 140 mmol/L (ref 135–145)
TOTAL PROTEIN: 5.9 g/dL — AB (ref 6.5–8.1)
Total Bilirubin: 0.3 mg/dL (ref 0.3–1.2)

## 2015-08-03 LAB — LIPASE, BLOOD: Lipase: 33 U/L (ref 11–51)

## 2015-08-03 MED ORDER — SODIUM CHLORIDE 0.9 % IV BOLUS (SEPSIS)
1000.0000 mL | Freq: Once | INTRAVENOUS | Status: AC
  Administered 2015-08-03: 1000 mL via INTRAVENOUS

## 2015-08-03 MED ORDER — DICYCLOMINE HCL 10 MG PO CAPS: 10.0000 mg | ORAL_CAPSULE | Freq: Three times a day (TID) | ORAL | Status: DC | PRN

## 2015-08-03 MED ORDER — IOHEXOL 300 MG/ML  SOLN
100.0000 mL | Freq: Once | INTRAMUSCULAR | Status: AC | PRN
  Administered 2015-08-03: 100 mL via INTRAVENOUS

## 2015-08-03 MED ORDER — MORPHINE SULFATE (PF) 4 MG/ML IV SOLN
6.0000 mg | Freq: Once | INTRAVENOUS | Status: DC
  Filled 2015-08-03: qty 2
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
## 2015-08-03 NOTE — Discharge Instructions (Signed)
Abdominal Pain, Adult Mr. Berline ChoughHooks, your CT scan does not show any cause for your pain.  Take bentyl as needed for your abdominal pain.  If symptoms worsen, come back to the ED immediately. Thank you. Many things can cause belly (abdominal) pain. Most times, the belly pain is not dangerous. Many cases of belly pain can be watched and treated at home. HOME CARE   Do not take medicines that help you go poop (laxatives) unless told to by your doctor.  Only take medicine as told by your doctor.  Eat or drink as told by your doctor. Your doctor will tell you if you should be on a special diet. GET HELP IF:  You do not know what is causing your belly pain.  You have belly pain while you are sick to your stomach (nauseous) or have runny poop (diarrhea).  You have pain while you pee or poop.  Your belly pain wakes you up at night.  You have belly pain that gets worse or better when you eat.  You have belly pain that gets worse when you eat fatty foods.  You have a fever. GET HELP RIGHT AWAY IF:   The pain does not go away within 2 hours.  You keep throwing up (vomiting).  The pain changes and is only in the right or left part of the belly.  You have bloody or tarry looking poop. MAKE SURE YOU:   Understand these instructions.  Will watch your condition.  Will get help right away if you are not doing well or get worse.   This information is not intended to replace advice given to you by your health care provider. Make sure you discuss any questions you have with your health care provider.   Document Released: 12/27/2007 Document Revised: 07/31/2014 Document Reviewed: 03/19/2013 Elsevier Interactive Patient Education [employer] Inc.
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
## 2015-08-18 ENCOUNTER — Encounter: Payer: Self-pay | Admitting: Gastroenterology

## 2015-10-07 ENCOUNTER — Ambulatory Visit: Payer: Medicaid Other | Admitting: Gastroenterology

## 2016-10-23 ENCOUNTER — Emergency Department (HOSPITAL_COMMUNITY): Payer: No Typology Code available for payment source

## 2016-10-23 ENCOUNTER — Emergency Department (HOSPITAL_COMMUNITY)
Admission: EM | Admit: 2016-10-23 | Discharge: 2016-10-23 | Disposition: A | Discharge status: Home / Self Care | Payer: No Typology Code available for payment source | Attending: Emergency Medicine | Admitting: Emergency Medicine

## 2016-10-23 ENCOUNTER — Encounter (HOSPITAL_COMMUNITY): Payer: Self-pay

## 2016-10-23 DIAGNOSIS — S82431A Displaced oblique fracture of shaft of right fibula, initial encounter for closed fracture: Secondary | ICD-10-CM | POA: Diagnosis not present

## 2016-10-23 DIAGNOSIS — Y92410 Unspecified street and highway as the place of occurrence of the external cause: Secondary | ICD-10-CM | POA: Diagnosis not present

## 2016-10-23 DIAGNOSIS — F17200 Nicotine dependence, unspecified, uncomplicated: Secondary | ICD-10-CM | POA: Diagnosis not present

## 2016-10-23 DIAGNOSIS — J45909 Unspecified asthma, uncomplicated: Secondary | ICD-10-CM | POA: Insufficient documentation

## 2016-10-23 DIAGNOSIS — S82891A Other fracture of right lower leg, initial encounter for closed fracture: Secondary | ICD-10-CM

## 2016-10-23 DIAGNOSIS — S8991XA Unspecified injury of right lower leg, initial encounter: Secondary | ICD-10-CM | POA: Diagnosis present

## 2016-10-23 DIAGNOSIS — Y939 Activity, unspecified: Secondary | ICD-10-CM | POA: Diagnosis not present

## 2016-10-23 DIAGNOSIS — Y999 Unspecified external cause status: Secondary | ICD-10-CM | POA: Diagnosis not present

## 2016-10-23 MED ORDER — MORPHINE SULFATE (PF) 4 MG/ML IV SOLN: 4.0000 mg | Freq: Once | INTRAVENOUS | Status: DC

## 2016-10-23 MED ORDER — OXYCODONE-ACETAMINOPHEN 10-325 MG PO TABS: 1.0000 | ORAL_TABLET | Freq: Four times a day (QID) | ORAL | 0 refills | Status: DC | PRN

## 2016-10-23 MED ORDER — OXYCODONE-ACETAMINOPHEN 5-325 MG PO TABS
2.0000 | ORAL_TABLET | Freq: Once | ORAL | Status: AC
  Administered 2016-10-23: 2 via ORAL
  Filled 2016-10-23: qty 2

## 2016-10-23 NOTE — ED Notes (Signed)
Pt stable, ambulatory, states understanding of discharge instructions, belongings collected by security to patient.
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
## 2016-10-23 NOTE — Progress Notes (Signed)
Orthopedic Tech Progress Note Patient Details:  [name] July 15, 1976 [id number]  Ortho Devices Type of Ortho Device: Short leg splint, Crutches Ortho Device/Splint Location: Applied Short leg splint to pt right foot/ankle. Pt tolerated well.  Provided crutches for pt right leg.  Pt ambulated well.  Pt previously used crutches.  Ortho Device/Splint Interventions: Application, Adjustment   [name] 10/23/2016, 11:09 PM

## 2016-10-23 NOTE — ED Notes (Signed)
Ortho tech paged  

## 2016-10-23 NOTE — Discharge Instructions (Signed)
No weight bearing to right leg. No driving while using narcotic medications.
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
## 2016-11-01 ENCOUNTER — Emergency Department (HOSPITAL_COMMUNITY)
Admission: EM | Admit: 2016-11-01 | Discharge: 2016-11-01 | Disposition: A | Discharge status: Home / Self Care | Payer: No Typology Code available for payment source | Attending: Emergency Medicine | Admitting: Emergency Medicine

## 2016-11-01 ENCOUNTER — Encounter (HOSPITAL_COMMUNITY): Payer: Self-pay

## 2016-11-01 ENCOUNTER — Emergency Department (HOSPITAL_COMMUNITY): Payer: No Typology Code available for payment source

## 2016-11-01 DIAGNOSIS — S82831D Other fracture of upper and lower end of right fibula, subsequent encounter for closed fracture with routine healing: Secondary | ICD-10-CM | POA: Diagnosis not present

## 2016-11-01 DIAGNOSIS — J45909 Unspecified asthma, uncomplicated: Secondary | ICD-10-CM | POA: Insufficient documentation

## 2016-11-01 DIAGNOSIS — Z5189 Encounter for other specified aftercare: Secondary | ICD-10-CM

## 2016-11-01 DIAGNOSIS — F17200 Nicotine dependence, unspecified, uncomplicated: Secondary | ICD-10-CM | POA: Insufficient documentation

## 2016-11-01 DIAGNOSIS — S99911D Unspecified injury of right ankle, subsequent encounter: Secondary | ICD-10-CM | POA: Diagnosis present

## 2016-11-01 MED ORDER — OXYCODONE-ACETAMINOPHEN 5-325 MG PO TABS
1.0000 | ORAL_TABLET | Freq: Once | ORAL | Status: AC
  Administered 2016-11-01: 1 via ORAL
  Filled 2016-11-01: qty 1

## 2016-11-01 MED ORDER — IBUPROFEN 800 MG PO TABS: 800.0000 mg | ORAL_TABLET | Freq: Three times a day (TID) | ORAL | 0 refills | Status: DC

## 2016-11-01 MED ORDER — OXYCODONE-ACETAMINOPHEN 10-325 MG PO TABS: 1.0000 | ORAL_TABLET | Freq: Four times a day (QID) | ORAL | 0 refills | Status: DC | PRN
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
## 2016-11-01 NOTE — ED Notes (Signed)
Ortho tech here to change ace wrap
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
## 2016-11-01 NOTE — ED Notes (Signed)
ACE wrap changed by ortho. Papers reviewed along with specific instructions for next weeks follow up here for surgery. He verbalizes understanding and intent to follow up
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
## 2016-11-08 ENCOUNTER — Encounter (HOSPITAL_COMMUNITY): Payer: Self-pay | Admitting: Emergency Medicine

## 2016-11-08 ENCOUNTER — Emergency Department (HOSPITAL_COMMUNITY)
Admission: EM | Admit: 2016-11-08 | Discharge: 2016-11-08 | Disposition: A | Discharge status: Home / Self Care | Payer: No Typology Code available for payment source | Attending: Emergency Medicine | Admitting: Emergency Medicine

## 2016-11-08 ENCOUNTER — Emergency Department (HOSPITAL_COMMUNITY): Payer: No Typology Code available for payment source

## 2016-11-08 DIAGNOSIS — Z79899 Other long term (current) drug therapy: Secondary | ICD-10-CM | POA: Insufficient documentation

## 2016-11-08 DIAGNOSIS — S99911D Unspecified injury of right ankle, subsequent encounter: Secondary | ICD-10-CM

## 2016-11-08 DIAGNOSIS — S82891A Other fracture of right lower leg, initial encounter for closed fracture: Secondary | ICD-10-CM

## 2016-11-08 DIAGNOSIS — F17200 Nicotine dependence, unspecified, uncomplicated: Secondary | ICD-10-CM | POA: Diagnosis not present

## 2016-11-08 DIAGNOSIS — J45909 Unspecified asthma, uncomplicated: Secondary | ICD-10-CM | POA: Diagnosis not present

## 2016-11-08 DIAGNOSIS — S8991XD Unspecified injury of right lower leg, subsequent encounter: Secondary | ICD-10-CM | POA: Diagnosis present

## 2016-11-08 DIAGNOSIS — S82821D Torus fracture of lower end of right fibula, subsequent encounter for fracture with routine healing: Secondary | ICD-10-CM | POA: Diagnosis not present

## 2016-11-08 MED ORDER — TRAMADOL HCL 50 MG PO TABS
50.0000 mg | ORAL_TABLET | Freq: Once | ORAL | Status: AC
  Administered 2016-11-08: 50 mg via ORAL
  Filled 2016-11-08: qty 1

## 2016-11-08 MED ORDER — TRAMADOL HCL 50 MG PO TABS: 50.0000 mg | ORAL_TABLET | Freq: Four times a day (QID) | ORAL | 0 refills | Status: DC | PRN
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
## 2016-11-08 NOTE — ED Triage Notes (Signed)
Pt reports last time he ate/drank was 9pm last night.

## 2016-11-08 NOTE — Discharge Instructions (Signed)
Leave boot on at all times but may come out for personal care.  Do not put any weight down on your right leg.

## 2016-11-08 NOTE — Progress Notes (Signed)
Orthopedic Tech Progress Note Patient Details:  [name] 1975/07/29 [id number]  Ortho Devices Type of Ortho Device: CAM walker Ortho Device/Splint Location: rle Ortho Device/Splint Interventions: Application   Jaivian Battaglini 11/08/2016, 9:50 AM

## 2016-11-08 NOTE — ED Notes (Signed)
Ortho tech at bedside to apply cam walker.
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
## 2016-12-28 ENCOUNTER — Emergency Department (HOSPITAL_COMMUNITY)
Admission: EM | Admit: 2016-12-28 | Discharge: 2016-12-28 | Disposition: A | Discharge status: Home / Self Care | Payer: Medicaid Other | Attending: Emergency Medicine | Admitting: Emergency Medicine

## 2016-12-28 ENCOUNTER — Encounter (HOSPITAL_COMMUNITY): Payer: Self-pay | Admitting: Vascular Surgery

## 2016-12-28 ENCOUNTER — Emergency Department (HOSPITAL_COMMUNITY): Payer: Self-pay

## 2016-12-28 DIAGNOSIS — Y92410 Unspecified street and highway as the place of occurrence of the external cause: Secondary | ICD-10-CM | POA: Insufficient documentation

## 2016-12-28 DIAGNOSIS — S82831A Other fracture of upper and lower end of right fibula, initial encounter for closed fracture: Secondary | ICD-10-CM | POA: Insufficient documentation

## 2016-12-28 DIAGNOSIS — F17200 Nicotine dependence, unspecified, uncomplicated: Secondary | ICD-10-CM | POA: Insufficient documentation

## 2016-12-28 DIAGNOSIS — J45909 Unspecified asthma, uncomplicated: Secondary | ICD-10-CM | POA: Insufficient documentation

## 2016-12-28 DIAGNOSIS — B86 Scabies: Secondary | ICD-10-CM | POA: Insufficient documentation

## 2016-12-28 DIAGNOSIS — Y999 Unspecified external cause status: Secondary | ICD-10-CM | POA: Insufficient documentation

## 2016-12-28 DIAGNOSIS — Y9389 Activity, other specified: Secondary | ICD-10-CM | POA: Insufficient documentation

## 2016-12-28 MED ORDER — PERMETHRIN 5 % EX CREA: TOPICAL_CREAM | CUTANEOUS | 1 refills | Status: DC

## 2016-12-28 NOTE — ED Triage Notes (Signed)
Pt reports he was outside last night and he believes that he has a chigger infestation. Has hx of having this before and states it felt similar. Reports generalized itching and irritation. He has tried putting rubbing alcohol all over himself and bleach without relief in his symptoms. Has not tried any antihistamines.

## 2016-12-28 NOTE — ED Notes (Signed)
Patient called x 4 with no response.  All areas of waiting room and outside sidewalk checked, unable to locate patient.
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
## 2016-12-28 NOTE — ED Notes (Signed)
See EDP secondary assessment.  

## 2017-09-08 ENCOUNTER — Other Ambulatory Visit: Payer: Self-pay

## 2017-09-08 ENCOUNTER — Emergency Department (HOSPITAL_COMMUNITY): Payer: Self-pay

## 2017-09-08 ENCOUNTER — Encounter (HOSPITAL_COMMUNITY): Payer: Self-pay

## 2017-09-08 ENCOUNTER — Emergency Department (HOSPITAL_COMMUNITY)
Admission: EM | Admit: 2017-09-08 | Discharge: 2017-09-08 | Disposition: A | Discharge status: Home / Self Care | Payer: Self-pay | Attending: Emergency Medicine | Admitting: Emergency Medicine

## 2017-09-08 DIAGNOSIS — L03011 Cellulitis of right finger: Secondary | ICD-10-CM | POA: Insufficient documentation

## 2017-09-08 DIAGNOSIS — F17210 Nicotine dependence, cigarettes, uncomplicated: Secondary | ICD-10-CM | POA: Insufficient documentation

## 2017-09-08 DIAGNOSIS — J45909 Unspecified asthma, uncomplicated: Secondary | ICD-10-CM | POA: Insufficient documentation

## 2017-09-08 MED ORDER — LIDOCAINE HCL (PF) 1 % IJ SOLN
5.0000 mL | Freq: Once | INTRAMUSCULAR | Status: AC
  Administered 2017-09-08: 5 mL
  Filled 2017-09-08: qty 30

## 2017-09-08 MED ORDER — SULFAMETHOXAZOLE-TRIMETHOPRIM 800-160 MG PO TABS: 1.0000 | ORAL_TABLET | Freq: Two times a day (BID) | ORAL | 0 refills | Status: AC

## 2017-09-08 MED ORDER — CEPHALEXIN 500 MG PO CAPS: 500.0000 mg | ORAL_CAPSULE | Freq: Four times a day (QID) | ORAL | 0 refills | Status: DC

## 2017-09-08 MED ORDER — TETANUS-DIPHTH-ACELL PERTUSSIS 5-2.5-18.5 LF-MCG/0.5 IM SUSP
0.5000 mL | Freq: Once | INTRAMUSCULAR | Status: AC
  Administered 2017-09-08: 0.5 mL via INTRAMUSCULAR
  Filled 2017-09-08: qty 0.5

## 2017-09-08 NOTE — Discharge Instructions (Signed)
Take the antibiotic as directed. Take tylenol and ibuprofen as needed for pain. Follow up with your doctor on Monday for recheck. Return here as needed.
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
## 2017-09-08 NOTE — ED Notes (Signed)
PT DISCHARGED. INSTRUCTIONS AND PRESCRIPTIONS GIVEN. AAOX4. PT IN NO APPARENT DISTRESS WITH MODERATE PAIN. THE OPPORTUNITY TO ASK QUESTIONS WAS PROVIDED. 

## 2017-09-08 NOTE — ED Triage Notes (Signed)
PT C/O PAIN AND SWELLING TO THE RIGHT THUMB X3 DAYS. PT STS, "IT FEELS LIKE SOMETHING IS UNDER MY NAIL". NO DRAINAGE SEEN AT THIS TIME.

## 2017-09-08 NOTE — ED Provider Notes (Signed)
  Face-to-face evaluation   History: He presents for evaluation of right thumb pain, for 3 days.  No known trauma.  No similar in the past.  Physical exam: Patient examined after digital block, and attempt at drainage of a paronychia.  Patient is agitated, has had lying on her arm which is lying on the examination table.  He barely lifts his head up to talk to me.  Right hand with tenderness over the first MCP, proximal and distal first phalanx.  Mild swelling of the pad of the thumb.  No clear evidence for a felon.  Thumb with radial aspect paronychia is present.  Patient was offered additional treatment, by me including repeat digital block, and a second drainage procedure.  Patient declines stating that he did not want any additional treatment, but knows that he should.  He will be offered symptomatic treatment, recommendations, and oral antibiotics to improve infection.  Medical screening examination/treatment/procedure(s) were conducted as a shared visit with non-physician practitioner(s) and myself.  I personally evaluated the patient during the encounter    Mancel BaleWentz, Patrick Blume, MD 09/09/17 2140

## 2017-10-25 ENCOUNTER — Emergency Department (HOSPITAL_COMMUNITY)
Admission: EM | Admit: 2017-10-25 | Discharge: 2017-10-25 | Discharge status: Absent without leave | Payer: Medicaid Other

## 2017-10-26 ENCOUNTER — Inpatient Hospital Stay (HOSPITAL_COMMUNITY): Payer: Self-pay | Admitting: Anesthesiology

## 2017-10-26 ENCOUNTER — Emergency Department (HOSPITAL_COMMUNITY): Payer: Self-pay

## 2017-10-26 ENCOUNTER — Other Ambulatory Visit: Payer: Self-pay

## 2017-10-26 ENCOUNTER — Encounter (HOSPITAL_COMMUNITY)
Admission: EM | Discharge status: Against Medical Advice | Payer: Self-pay | Source: Home / Self Care | Attending: Internal Medicine

## 2017-10-26 ENCOUNTER — Encounter (HOSPITAL_COMMUNITY): Payer: Self-pay | Admitting: Emergency Medicine

## 2017-10-26 ENCOUNTER — Inpatient Hospital Stay (HOSPITAL_COMMUNITY)
Admission: EM | Admit: 2017-10-26 | Discharge: 2017-10-28 | DRG: 580 | Discharge status: Against Medical Advice | Payer: Self-pay | Attending: Internal Medicine | Admitting: Internal Medicine

## 2017-10-26 DIAGNOSIS — M65141 Other infective (teno)synovitis, right hand: Secondary | ICD-10-CM | POA: Diagnosis present

## 2017-10-26 DIAGNOSIS — Z7951 Long term (current) use of inhaled steroids: Secondary | ICD-10-CM

## 2017-10-26 DIAGNOSIS — J45909 Unspecified asthma, uncomplicated: Secondary | ICD-10-CM | POA: Diagnosis present

## 2017-10-26 DIAGNOSIS — M79641 Pain in right hand: Secondary | ICD-10-CM

## 2017-10-26 DIAGNOSIS — M7989 Other specified soft tissue disorders: Secondary | ICD-10-CM

## 2017-10-26 DIAGNOSIS — L02519 Cutaneous abscess of unspecified hand: Secondary | ICD-10-CM | POA: Diagnosis present

## 2017-10-26 DIAGNOSIS — L02511 Cutaneous abscess of right hand: Principal | ICD-10-CM | POA: Diagnosis present

## 2017-10-26 DIAGNOSIS — F17213 Nicotine dependence, cigarettes, with withdrawal: Secondary | ICD-10-CM | POA: Diagnosis not present

## 2017-10-26 DIAGNOSIS — G5601 Carpal tunnel syndrome, right upper limb: Secondary | ICD-10-CM | POA: Diagnosis present

## 2017-10-26 HISTORY — PX: I&D EXTREMITY: SHX5045

## 2017-10-26 HISTORY — DX: Calculus of kidney: N20.0

## 2017-10-26 LAB — COMPREHENSIVE METABOLIC PANEL
ALK PHOS: 115 U/L (ref 38–126)
ALT: 17 U/L (ref 17–63)
AST: 21 U/L (ref 15–41)
Albumin: 4.1 g/dL (ref 3.5–5.0)
Anion gap: 11 (ref 5–15)
BILIRUBIN TOTAL: 0.6 mg/dL (ref 0.3–1.2)
BUN: 11 mg/dL (ref 6–20)
CALCIUM: 9.8 mg/dL (ref 8.9–10.3)
CHLORIDE: 103 mmol/L (ref 101–111)
CO2: 24 mmol/L (ref 22–32)
CREATININE: 0.94 mg/dL (ref 0.61–1.24)
GFR calc non Af Amer: >60 mL/min (ref >60)
Glucose, Bld: 99 mg/dL (ref 65–99)
Potassium: 3.6 mmol/L (ref 3.5–5.1)
Sodium: 138 mmol/L (ref 135–145)
Total Protein: 7.5 g/dL (ref 6.5–8.1)

## 2017-10-26 LAB — CBC WITH DIFFERENTIAL/PLATELET
BASOS PCT: 0 %
Basophils Absolute: 0 10*3/uL (ref 0.0–0.1)
EOS ABS: 0.4 10*3/uL (ref 0.0–0.7)
EOS PCT: 3 %
HCT: 41.5 % (ref 39.0–52.0)
Hemoglobin: 13.6 g/dL (ref 13.0–17.0)
LYMPHS ABS: 1.8 10*3/uL (ref 0.7–4.0)
Lymphocytes Relative: 14 %
MCH: 29.1 pg (ref 26.0–34.0)
MCHC: 32.8 g/dL (ref 30.0–36.0)
MCV: 88.7 fL (ref 78.0–100.0)
Monocytes Absolute: 0.8 10*3/uL (ref 0.1–1.0)
Monocytes Relative: 6 %
Neutro Abs: 10.2 10*3/uL — ABNORMAL HIGH (ref 1.7–7.7)
Neutrophils Relative %: 77 %
PLATELETS: 288 10*3/uL (ref 150–400)
RBC: 4.68 MIL/uL (ref 4.22–5.81)
RDW: 13.8 % (ref 11.5–15.5)
WBC: 13.1 10*3/uL — AB (ref 4.0–10.5)

## 2017-10-26 LAB — I-STAT CG4 LACTIC ACID, ED: LACTIC ACID, VENOUS: 0.97 mmol/L (ref 0.5–1.9)

## 2017-10-26 LAB — MRSA PCR SCREENING: MRSA by PCR: NEGATIVE

## 2017-10-26 LAB — HEMOGLOBIN A1C
Hgb A1c MFr Bld: 5.8 % — ABNORMAL HIGH (ref 4.8–5.6)
Mean Plasma Glucose: 119.76 mg/dL

## 2017-10-26 LAB — SEDIMENTATION RATE: SED RATE: 15 mm/h (ref 0–16)

## 2017-10-26 SURGERY — IRRIGATION AND DEBRIDEMENT EXTREMITY
Anesthesia: General | Site: Hand | Laterality: Right

## 2017-10-26 MED ORDER — HYDROMORPHONE HCL 2 MG/ML IJ SOLN: 2.0000 mg | INTRAMUSCULAR | Status: DC | PRN

## 2017-10-26 MED ORDER — SENNA 8.6 MG PO TABS
1.0000 | ORAL_TABLET | Freq: Two times a day (BID) | ORAL | Status: DC
  Administered 2017-10-26 – 2017-10-28 (×4): 8.6 mg via ORAL
  Filled 2017-10-26 (×5): qty 1

## 2017-10-26 MED ORDER — KETOROLAC TROMETHAMINE 30 MG/ML IJ SOLN
30.0000 mg | Freq: Three times a day (TID) | INTRAMUSCULAR | Status: DC
  Administered 2017-10-27 – 2017-10-28 (×4): 30 mg via INTRAVENOUS
  Filled 2017-10-26 (×5): qty 1

## 2017-10-26 MED ORDER — VANCOMYCIN HCL IN DEXTROSE 1-5 GM/200ML-% IV SOLN
INTRAVENOUS | Status: AC
  Filled 2017-10-26: qty 200

## 2017-10-26 MED ORDER — PIPERACILLIN-TAZOBACTAM 3.375 G IVPB 30 MIN
3.3750 g | Freq: Once | INTRAVENOUS | Status: AC
  Administered 2017-10-26: 3.375 g via INTRAVENOUS
  Filled 2017-10-26: qty 50

## 2017-10-26 MED ORDER — PROPOFOL 10 MG/ML IV BOLUS
INTRAVENOUS | Status: AC
  Filled 2017-10-26: qty 40

## 2017-10-26 MED ORDER — MAGNESIUM CITRATE PO SOLN: 1.0000 | Freq: Once | ORAL | Status: DC | PRN

## 2017-10-26 MED ORDER — 0.9 % SODIUM CHLORIDE (POUR BTL) OPTIME
TOPICAL | Status: DC | PRN
  Administered 2017-10-26: 1000 mL

## 2017-10-26 MED ORDER — VANCOMYCIN HCL IN DEXTROSE 1-5 GM/200ML-% IV SOLN: 1000.0000 mg | INTRAVENOUS | Status: DC

## 2017-10-26 MED ORDER — PROPOFOL 10 MG/ML IV BOLUS
INTRAVENOUS | Status: DC | PRN
  Administered 2017-10-26: 200 mg via INTRAVENOUS

## 2017-10-26 MED ORDER — ONDANSETRON HCL 4 MG PO TABS: 4.0000 mg | ORAL_TABLET | Freq: Four times a day (QID) | ORAL | Status: DC | PRN

## 2017-10-26 MED ORDER — ONDANSETRON HCL 4 MG/2ML IJ SOLN
4.0000 mg | Freq: Once | INTRAMUSCULAR | Status: AC
  Administered 2017-10-26: 4 mg via INTRAVENOUS
  Filled 2017-10-26: qty 2

## 2017-10-26 MED ORDER — VANCOMYCIN HCL IN DEXTROSE 1-5 GM/200ML-% IV SOLN
1000.0000 mg | Freq: Once | INTRAVENOUS | Status: AC
  Administered 2017-10-26: 1000 mg via INTRAVENOUS
  Filled 2017-10-26: qty 200

## 2017-10-26 MED ORDER — VITAMIN C 500 MG PO TABS
1000.0000 mg | ORAL_TABLET | Freq: Every day | ORAL | Status: DC
  Administered 2017-10-27 – 2017-10-28 (×2): 1000 mg via ORAL
  Filled 2017-10-26 (×2): qty 2

## 2017-10-26 MED ORDER — CEFAZOLIN SODIUM-DEXTROSE 1-4 GM/50ML-% IV SOLN
1.0000 g | Freq: Once | INTRAVENOUS | Status: AC
  Administered 2017-10-26: 1 g via INTRAVENOUS
  Filled 2017-10-26: qty 50

## 2017-10-26 MED ORDER — HYDROMORPHONE HCL 1 MG/ML IJ SOLN
1.0000 mg | INTRAMUSCULAR | Status: DC | PRN
  Administered 2017-10-28: 1 mg via INTRAVENOUS
  Filled 2017-10-26: qty 1

## 2017-10-26 MED ORDER — LACTATED RINGERS IV SOLN
INTRAVENOUS | Status: DC | PRN
  Administered 2017-10-26 (×2): via INTRAVENOUS

## 2017-10-26 MED ORDER — DEXAMETHASONE SODIUM PHOSPHATE 10 MG/ML IJ SOLN
INTRAMUSCULAR | Status: DC | PRN
  Administered 2017-10-26: 10 mg via INTRAVENOUS

## 2017-10-26 MED ORDER — PIPERACILLIN-TAZOBACTAM 3.375 G IVPB
3.3750 g | Freq: Three times a day (TID) | INTRAVENOUS | Status: DC
  Administered 2017-10-26 – 2017-10-28 (×5): 3.375 g via INTRAVENOUS
  Filled 2017-10-26 (×7): qty 50

## 2017-10-26 MED ORDER — POLYETHYLENE GLYCOL 3350 17 G PO PACK
17.0000 g | PACK | Freq: Every day | ORAL | Status: DC
  Administered 2017-10-27 – 2017-10-28 (×2): 17 g via ORAL
  Filled 2017-10-26 (×2): qty 1

## 2017-10-26 MED ORDER — MIDAZOLAM HCL 2 MG/2ML IJ SOLN
INTRAMUSCULAR | Status: AC
  Filled 2017-10-26: qty 2

## 2017-10-26 MED ORDER — VANCOMYCIN HCL IN DEXTROSE 1-5 GM/200ML-% IV SOLN
1000.0000 mg | Freq: Two times a day (BID) | INTRAVENOUS | Status: DC
  Administered 2017-10-26 – 2017-10-27 (×3): 1000 mg via INTRAVENOUS
  Filled 2017-10-26 (×4): qty 200

## 2017-10-26 MED ORDER — LORAZEPAM 2 MG/ML IJ SOLN
1.0000 mg | INTRAMUSCULAR | Status: DC | PRN
  Administered 2017-10-26: 1 mg via INTRAMUSCULAR
  Filled 2017-10-26: qty 1

## 2017-10-26 MED ORDER — FENTANYL CITRATE (PF) 250 MCG/5ML IJ SOLN
INTRAMUSCULAR | Status: AC
  Filled 2017-10-26: qty 5

## 2017-10-26 MED ORDER — HYDROMORPHONE HCL 1 MG/ML IJ SOLN
INTRAMUSCULAR | Status: AC
  Filled 2017-10-26: qty 1

## 2017-10-26 MED ORDER — ONDANSETRON HCL 4 MG/2ML IJ SOLN: 4.0000 mg | Freq: Four times a day (QID) | INTRAMUSCULAR | Status: DC | PRN

## 2017-10-26 MED ORDER — ONDANSETRON HCL 4 MG/2ML IJ SOLN
INTRAMUSCULAR | Status: AC
  Filled 2017-10-26: qty 2

## 2017-10-26 MED ORDER — ACETAMINOPHEN 500 MG PO TABS
1000.0000 mg | ORAL_TABLET | Freq: Once | ORAL | Status: AC
  Administered 2017-10-26: 1000 mg via ORAL
  Filled 2017-10-26: qty 2

## 2017-10-26 MED ORDER — ALBUTEROL SULFATE (2.5 MG/3ML) 0.083% IN NEBU: 2.5000 mg | INHALATION_SOLUTION | Freq: Four times a day (QID) | RESPIRATORY_TRACT | Status: DC | PRN

## 2017-10-26 MED ORDER — HYDROMORPHONE HCL 1 MG/ML IJ SOLN
1.0000 mg | Freq: Once | INTRAMUSCULAR | Status: AC
  Administered 2017-10-26: 1 mg via INTRAVENOUS
  Filled 2017-10-26: qty 1

## 2017-10-26 MED ORDER — OXYCODONE HCL 5 MG/5ML PO SOLN: 5.0000 mg | Freq: Once | ORAL | Status: DC | PRN

## 2017-10-26 MED ORDER — SODIUM CHLORIDE 0.9% FLUSH
3.0000 mL | Freq: Two times a day (BID) | INTRAVENOUS | Status: DC
  Administered 2017-10-26 – 2017-10-27 (×3): 3 mL via INTRAVENOUS

## 2017-10-26 MED ORDER — ALPRAZOLAM 0.5 MG PO TABS: 0.5000 mg | ORAL_TABLET | Freq: Four times a day (QID) | ORAL | Status: DC | PRN

## 2017-10-26 MED ORDER — FENTANYL CITRATE (PF) 250 MCG/5ML IJ SOLN
INTRAMUSCULAR | Status: DC | PRN
  Administered 2017-10-26: 50 ug via INTRAVENOUS
  Administered 2017-10-26: 100 ug via INTRAVENOUS
  Administered 2017-10-26: 150 ug via INTRAVENOUS

## 2017-10-26 MED ORDER — BACITRACIN-NEOMYCIN-POLYMYXIN 400-5-5000 EX OINT
TOPICAL_OINTMENT | CUTANEOUS | Status: AC
  Filled 2017-10-26: qty 1

## 2017-10-26 MED ORDER — ACETAMINOPHEN 500 MG PO TABS
1000.0000 mg | ORAL_TABLET | Freq: Three times a day (TID) | ORAL | Status: DC
  Administered 2017-10-27 – 2017-10-28 (×4): 1000 mg via ORAL
  Filled 2017-10-26 (×5): qty 2

## 2017-10-26 MED ORDER — DIPHENHYDRAMINE HCL 50 MG/ML IJ SOLN
25.0000 mg | Freq: Once | INTRAMUSCULAR | Status: AC
  Administered 2017-10-26: 25 mg via INTRAVENOUS
  Filled 2017-10-26: qty 1

## 2017-10-26 MED ORDER — METHOCARBAMOL 1000 MG/10ML IJ SOLN
500.0000 mg | Freq: Four times a day (QID) | INTRAMUSCULAR | Status: DC | PRN
  Administered 2017-10-27: 500 mg via INTRAVENOUS
  Filled 2017-10-26 (×2): qty 5

## 2017-10-26 MED ORDER — POVIDONE-IODINE 10 % EX SWAB: 2.0000 "application " | Freq: Once | CUTANEOUS | Status: DC

## 2017-10-26 MED ORDER — DEXAMETHASONE SODIUM PHOSPHATE 10 MG/ML IJ SOLN
INTRAMUSCULAR | Status: AC
  Filled 2017-10-26: qty 1

## 2017-10-26 MED ORDER — SODIUM CHLORIDE 0.9 % IR SOLN
Status: DC | PRN
  Administered 2017-10-26 (×2): 3000 mL

## 2017-10-26 MED ORDER — ONDANSETRON HCL 4 MG/2ML IJ SOLN
INTRAMUSCULAR | Status: DC | PRN
  Administered 2017-10-26: 4 mg via INTRAVENOUS

## 2017-10-26 MED ORDER — LABETALOL HCL 5 MG/ML IV SOLN
INTRAVENOUS | Status: AC
  Filled 2017-10-26: qty 4

## 2017-10-26 MED ORDER — HYDROMORPHONE HCL 1 MG/ML IJ SOLN: 0.2500 mg | INTRAMUSCULAR | Status: DC | PRN

## 2017-10-26 MED ORDER — MORPHINE SULFATE (CONCENTRATE) 10 MG/0.5ML PO SOLN
20.0000 mg | ORAL | Status: DC | PRN
  Administered 2017-10-26: 20 mg via ORAL
  Filled 2017-10-26: qty 1

## 2017-10-26 MED ORDER — LABETALOL HCL 5 MG/ML IV SOLN
5.0000 mg | INTRAVENOUS | Status: DC | PRN
  Administered 2017-10-26: 5 mg via INTRAVENOUS

## 2017-10-26 MED ORDER — OXYCODONE HCL 5 MG PO TABS
10.0000 mg | ORAL_TABLET | Freq: Four times a day (QID) | ORAL | Status: DC | PRN
Start: 2017-10-26 — End: 2017-10-28
  Administered 2017-10-28: 10 mg via ORAL
  Filled 2017-10-26: qty 2

## 2017-10-26 MED ORDER — FAMOTIDINE 20 MG PO TABS
20.0000 mg | ORAL_TABLET | Freq: Every day | ORAL | Status: DC
  Administered 2017-10-27 – 2017-10-28 (×2): 20 mg via ORAL
  Filled 2017-10-26 (×2): qty 1

## 2017-10-26 MED ORDER — DOCUSATE SODIUM 100 MG PO CAPS
100.0000 mg | ORAL_CAPSULE | Freq: Two times a day (BID) | ORAL | Status: DC
  Administered 2017-10-26 – 2017-10-28 (×4): 100 mg via ORAL
  Filled 2017-10-26 (×5): qty 1

## 2017-10-26 MED ORDER — DIPHENHYDRAMINE HCL 25 MG PO CAPS: 25.0000 mg | ORAL_CAPSULE | Freq: Four times a day (QID) | ORAL | Status: DC | PRN

## 2017-10-26 MED ORDER — BISACODYL 5 MG PO TBEC: 5.0000 mg | DELAYED_RELEASE_TABLET | Freq: Every day | ORAL | Status: DC | PRN

## 2017-10-26 MED ORDER — DEXTROSE-NACL 5-0.45 % IV SOLN
INTRAVENOUS | Status: AC
  Administered 2017-10-26: 12:00:00 via INTRAVENOUS

## 2017-10-26 MED ORDER — PROMETHAZINE HCL 25 MG/ML IJ SOLN
INTRAMUSCULAR | Status: AC
  Filled 2017-10-26: qty 1

## 2017-10-26 MED ORDER — HYDROMORPHONE HCL 2 MG PO TABS: 4.0000 mg | ORAL_TABLET | ORAL | Status: DC | PRN

## 2017-10-26 MED ORDER — KETOROLAC TROMETHAMINE 30 MG/ML IJ SOLN
30.0000 mg | Freq: Once | INTRAMUSCULAR | Status: AC
  Administered 2017-10-26: 30 mg via INTRAVENOUS
  Filled 2017-10-26: qty 1

## 2017-10-26 MED ORDER — PROMETHAZINE HCL 25 MG/ML IJ SOLN: 6.2500 mg | INTRAMUSCULAR | Status: DC | PRN

## 2017-10-26 MED ORDER — MORPHINE SULFATE (PF) 4 MG/ML IV SOLN
4.0000 mg | Freq: Once | INTRAVENOUS | Status: AC
Start: 2017-10-26 — End: 2017-10-26
  Administered 2017-10-26: 4 mg via INTRAVENOUS
  Filled 2017-10-26: qty 1

## 2017-10-26 MED ORDER — CHLORHEXIDINE GLUCONATE 4 % EX LIQD: 60.0000 mL | Freq: Once | CUTANEOUS | Status: DC

## 2017-10-26 MED ORDER — LIDOCAINE HCL (CARDIAC) 20 MG/ML IV SOLN
INTRAVENOUS | Status: DC | PRN
  Administered 2017-10-26: 80 mg via INTRATRACHEAL

## 2017-10-26 MED ORDER — SUCCINYLCHOLINE 20MG/ML (10ML) SYRINGE FOR MEDFUSION PUMP - OPTIME
INTRAMUSCULAR | Status: DC | PRN
  Administered 2017-10-26: 30 mg via INTRAVENOUS

## 2017-10-26 MED ORDER — OXYCODONE HCL 5 MG PO TABS: 5.0000 mg | ORAL_TABLET | Freq: Once | ORAL | Status: DC | PRN

## 2017-10-26 MED ORDER — SODIUM CHLORIDE 0.9 % IV BOLUS
1000.0000 mL | Freq: Once | INTRAVENOUS | Status: AC
  Administered 2017-10-26: 1000 mL via INTRAVENOUS

## 2017-10-26 MED ORDER — HYDROMORPHONE HCL 2 MG/ML IJ SOLN
2.0000 mg | Freq: Once | INTRAMUSCULAR | Status: DC
  Filled 2017-10-26: qty 1

## 2017-10-26 MED ORDER — NICOTINE 21 MG/24HR TD PT24
21.0000 mg | MEDICATED_PATCH | Freq: Every day | TRANSDERMAL | Status: DC
  Administered 2017-10-26 – 2017-10-28 (×3): 21 mg via TRANSDERMAL
  Filled 2017-10-26 (×4): qty 1

## 2017-10-26 MED ORDER — MIDAZOLAM HCL 2 MG/2ML IJ SOLN
INTRAMUSCULAR | Status: DC | PRN
  Administered 2017-10-26: 2 mg via INTRAVENOUS

## 2017-10-26 SURGICAL SUPPLY — 48 items
BANDAGE ACE 3X5.8 VEL STRL LF (GAUZE/BANDAGES/DRESSINGS) ×1 IMPLANT
BANDAGE ACE 4X5 VEL STRL LF (GAUZE/BANDAGES/DRESSINGS) ×2 IMPLANT
BNDG CONFORM 2 STRL LF (GAUZE/BANDAGES/DRESSINGS) IMPLANT
BNDG ELASTIC 2X5.8 VLCR STR LF (GAUZE/BANDAGES/DRESSINGS) ×1 IMPLANT
BNDG GAUZE ELAST 4 BULKY (GAUZE/BANDAGES/DRESSINGS) ×2 IMPLANT
CORDS BIPOLAR (ELECTRODE) ×2 IMPLANT
COVER SURGICAL LIGHT HANDLE (MISCELLANEOUS) ×2 IMPLANT
CUFF TOURNIQUET SINGLE 18IN (TOURNIQUET CUFF) ×2 IMPLANT
CUFF TOURNIQUET SINGLE 24IN (TOURNIQUET CUFF) IMPLANT
DRSG ADAPTIC 3X8 NADH LF (GAUZE/BANDAGES/DRESSINGS) ×2 IMPLANT
DRSG MEPILEX BORDER 4X8 (GAUZE/BANDAGES/DRESSINGS) ×2 IMPLANT
GAUZE SPONGE 4X4 12PLY STRL (GAUZE/BANDAGES/DRESSINGS) ×2 IMPLANT
GAUZE XEROFORM 1X8 LF (GAUZE/BANDAGES/DRESSINGS) ×2 IMPLANT
GAUZE XEROFORM 5X9 LF (GAUZE/BANDAGES/DRESSINGS) ×1 IMPLANT
GLOVE BIOGEL M 8.0 STRL (GLOVE) ×2 IMPLANT
GLOVE SS BIOGEL STRL SZ 8 (GLOVE) ×1 IMPLANT
GLOVE SUPERSENSE BIOGEL SZ 8 (GLOVE) ×1
GOWN STRL REUS W/ TWL LRG LVL3 (GOWN DISPOSABLE) ×1 IMPLANT
GOWN STRL REUS W/ TWL XL LVL3 (GOWN DISPOSABLE) ×2 IMPLANT
GOWN STRL REUS W/TWL LRG LVL3 (GOWN DISPOSABLE) ×2
GOWN STRL REUS W/TWL XL LVL3 (GOWN DISPOSABLE) ×4
KIT BASIN OR (CUSTOM PROCEDURE TRAY) ×2 IMPLANT
KIT TURNOVER KIT B (KITS) ×2 IMPLANT
MANIFOLD NEPTUNE II (INSTRUMENTS) ×2 IMPLANT
NDL HYPO 25GX1X1/2 BEV (NEEDLE) IMPLANT
NEEDLE HYPO 25GX1X1/2 BEV (NEEDLE) IMPLANT
NS IRRIG 1000ML POUR BTL (IV SOLUTION) ×2 IMPLANT
PACK ORTHO EXTREMITY (CUSTOM PROCEDURE TRAY) ×2 IMPLANT
PAD ARMBOARD 7.5X6 YLW CONV (MISCELLANEOUS) ×2 IMPLANT
PAD CAST 3X4 CTTN HI CHSV (CAST SUPPLIES) IMPLANT
PAD CAST 4YDX4 CTTN HI CHSV (CAST SUPPLIES) ×1 IMPLANT
PADDING CAST COTTON 3X4 STRL (CAST SUPPLIES) ×2
PADDING CAST COTTON 4X4 STRL (CAST SUPPLIES) ×2
PILLOW ARM CARTER ADULT (MISCELLANEOUS) ×2 IMPLANT
SCRUB BETADINE 4OZ XXX (MISCELLANEOUS) ×2 IMPLANT
SET IRRIG Y TYPE TUR BLADDER L (SET/KITS/TRAYS/PACK) ×2 IMPLANT
SOL PREP POV-IOD 4OZ 10% (MISCELLANEOUS) ×2 IMPLANT
SPLINT FIBERGLASS 3X12 (CAST SUPPLIES) ×4 IMPLANT
SPONGE LAP 4X18 X RAY DECT (DISPOSABLE) ×2 IMPLANT
SWAB CULTURE ESWAB REG 1ML (MISCELLANEOUS) ×1 IMPLANT
SYR CONTROL 10ML LL (SYRINGE) IMPLANT
SYSTEM CHEST DRAIN TLS 7FR (DRAIN) ×1 IMPLANT
TOWEL OR 17X24 6PK STRL BLUE (TOWEL DISPOSABLE) ×2 IMPLANT
TOWEL OR 17X26 10 PK STRL BLUE (TOWEL DISPOSABLE) ×2 IMPLANT
TUBE ANAEROBIC SPECIMEN COL (MISCELLANEOUS) ×2 IMPLANT
TUBE CONNECTING 12X1/4 (SUCTIONS) ×2 IMPLANT
WATER STERILE IRR 1000ML POUR (IV SOLUTION) ×1 IMPLANT
YANKAUER SUCT BULB TIP NO VENT (SUCTIONS) ×2 IMPLANT

## 2017-10-26 NOTE — Anesthesia Preprocedure Evaluation (Addendum)
Anesthesia Evaluation  Patient identified by MRN, date of birth, ID band Patient awake    Reviewed: Allergy & Precautions, H&P , NPO status , Patient's Chart, lab work & pertinent test results, reviewed documented beta blocker date and time   Airway Mallampati: II  TM Distance: >3 FB Neck ROM: full    Dental no notable dental hx.    Pulmonary asthma , Current Smoker,     + wheezing      Cardiovascular negative cardio ROS   Rhythm:regular Rate:Normal     Neuro/Psych negative neurological ROS  negative psych ROS   GI/Hepatic negative GI ROS, Neg liver ROS,   Endo/Other  negative endocrine ROS  Renal/GU negative Renal ROS     Musculoskeletal negative musculoskeletal ROS (+)   Abdominal   Peds  Hematology negative hematology ROS (+)   Anesthesia Other Findings infection  Reproductive/Obstetrics                            Anesthesia Physical Anesthesia Plan  ASA: II  Anesthesia Plan: General   Post-op Pain Management:    Induction: Intravenous  PONV Risk Score and Plan: 1 and Ondansetron, Dexamethasone, Midazolam and Treatment may vary due to age or medical condition  Airway Management Planned: LMA  Additional Equipment:   Intra-op Plan:   Post-operative Plan: Extubation in OR  Informed Consent: I have reviewed the patients History and Physical, chart, labs and discussed the procedure including the risks, benefits and alternatives for the proposed anesthesia with the patient or authorized representative who has indicated his/her understanding and acceptance.   Dental advisory given  Plan Discussed with: CRNA  Anesthesia Plan Comments:         Anesthesia Quick Evaluation

## 2017-10-26 NOTE — ED Notes (Signed)
EDP at bedside  

## 2017-10-26 NOTE — Op Note (Signed)
See dictation# [id number]  Patient underwent extensive irrigation debridement of the right thumb as well as thenar space with fasciotomy and advanced and extensive flexor tenolysis tenosynovectomy with open carpal tunnel release.  Will return to the operative theater Sunday for repeat I&D to try and afford some degree of improvement in this very aggressive and infection.  This is a very significant infection and certainly there is real risk of loss of function and tissue.  I am highly concerned given the fact this is gone on over a month and his intraoperative findings were completely impressive with a large amount of infectious focus throughout the thumb wrist and distal forearm.  This required irrigation debridement not and leaving the wounds wide open so that they can drain over the next 36 hours until we perform his repeat debridement.  He appears to have some challenges outside of his infection and hopefully we can work on these as well.  All questions have been addressed  Amanda PeaGramig MD
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
## 2017-10-26 NOTE — ED Triage Notes (Signed)
Pt from home with c/o swelling and inability to move thumb and palm of right hand. Pt has adequate radial pulse. Cap refill can not be determined as pt has had hand in water for over an hour and hand is discolored. Pt states he has had injury x 3 days and thinks it was due to playing video games

## 2017-10-26 NOTE — ED Notes (Signed)
Tried to get vitals but pt pulled everything off

## 2017-10-26 NOTE — ED Notes (Addendum)
Pt transported to xray 

## 2017-10-26 NOTE — Progress Notes (Signed)
Patient's BP 150s/100s.  Dr. Kathlene NovemberElllender notified and orders received for IV labetalol.  Will administer and continue to monitor.

## 2017-10-26 NOTE — Anesthesia Postprocedure Evaluation (Signed)
Anesthesia Post Note  Patient: [name]  Procedure(s) Performed: IRRIGATION AND DEBRIDEMENT HAND (Right Hand)     Patient location during evaluation: PACU Anesthesia Type: General Level of consciousness: sedated Pain management: pain level controlled Vital Signs Assessment: post-procedure vital signs reviewed and stable Respiratory status: spontaneous breathing, nonlabored ventilation, respiratory function stable and patient connected to nasal cannula oxygen Cardiovascular status: blood pressure returned to baseline and stable Postop Assessment: no apparent nausea or vomiting Anesthetic complications: no    Last Vitals:  Vitals:   10/26/17 2155 10/26/17 2200  BP: (!) 136/94 (!) 134/91  Pulse: 92 94  Resp: 12 14  Temp: (!) 36.4 C   SpO2: 100% 99%    Last Pain:  Vitals:   10/26/17 2215  TempSrc:   PainSc: 1                  Ryan P Ellender

## 2017-10-26 NOTE — ED Notes (Signed)
Patient appears to be better pain wise, morphine helped earlier. MD paged regarding a lower dose of pain medication.

## 2017-10-26 NOTE — ED Notes (Signed)
MD paged regarding need for IM medication as no IV access. Water engineerN and Charge RN attempted. Patient moving and yelling during IV insertion.
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
## 2017-10-26 NOTE — ED Notes (Signed)
ED TO INPATIENT HANDOFF REPORT  Name/Age/Gender [name] 42 y.o. male  Code Status    Code Status Orders  (From admission, onward)        Start     Ordered   10/26/17 1006  Full code  Continuous     10/26/17 1008    Code Status History    Date Active Date Inactive Code Status Order ID Comments User Context   11/04/2011 0117 11/04/2011 1717 Full Code [id number]  Mirna Mires, MD ED      Home/SNF/Other home  Chief Complaint Hand Pain  Level of Care/Admitting Diagnosis ED Disposition    ED Disposition Condition [place]: [facility] [100100]  Level of Care: Med-Surg [16]  Diagnosis: Hand abscess [037048]  Admitting Physician: Janece Canterbury [phone]  Attending Physician: Janece Canterbury [phone]  Estimated length of stay: 3 - 4 days  Certification:: I certify this patient will need inpatient services for at least 2 midnights  PT Class (Do Not Modify): Inpatient [101]  PT Acc Code (Do Not Modify): Private [1]       Medical History Past Medical History:  Diagnosis Date  . Asthma   . Kidney stones     Allergies No Known Allergies  IV Location/Drains/Wounds Patient Lines/Drains/Airways Status   Active Line/Drains/Airways    Name:   Placement date:   Placement time:   Site:   Days:   Peripheral IV 10/26/17 Left Hand   10/26/17    1115    Hand   less than 1          Labs/Imaging Results for orders placed or performed during the hospital encounter of 10/26/17 (from the past 48 hour(s))  Comprehensive metabolic panel     Status: None   Collection Time: 10/26/17  6:14 AM  Result Value Ref Range   Sodium 138 135 - 145 mmol/L   Potassium 3.6 3.5 - 5.1 mmol/L   Chloride 103 101 - 111 mmol/L   CO2 24 22 - 32 mmol/L   Glucose, Bld 99 65 - 99 mg/dL   BUN 11 6 - 20 mg/dL   Creatinine, Ser 0.94 0.61 - 1.24 mg/dL   Calcium 9.8 8.9 - 10.3 mg/dL   Total Protein 7.5 6.5 - 8.1 g/dL   Albumin 4.1 3.5 - 5.0 g/dL   AST  21 15 - 41 U/L   ALT 17 17 - 63 U/L   Alkaline Phosphatase 115 38 - 126 U/L   Total Bilirubin 0.6 0.3 - 1.2 mg/dL   GFR calc non Af Amer >60 >60 mL/min   GFR calc Af Amer >60 >60 mL/min    Comment: (NOTE) The eGFR has been calculated using the CKD EPI equation. This calculation has not been validated in all clinical situations. eGFR's persistently <60 mL/min signify possible Chronic Kidney Disease.    Anion gap 11 5 - 15    Comment: Performed at [facility], [place] [address], [place], [place] [address]  CBC with Differential     Status: Abnormal   Collection Time: 10/26/17  6:14 AM  Result Value Ref Range   WBC 13.1 (H) 4.0 - 10.5 K/uL   RBC 4.68 4.22 - 5.81 MIL/uL   Hemoglobin 13.6 13.0 - 17.0 g/dL   HCT 41.5 39.0 - 52.0 %   MCV 88.7 78.0 - 100.0 fL   MCH 29.1 26.0 - 34.0 pg   MCHC 32.8 30.0 - 36.0 g/dL  RDW 13.8 11.5 - 15.5 %   Platelets 288 150 - 400 K/uL   Neutrophils Relative % 77 %   Neutro Abs 10.2 (H) 1.7 - 7.7 K/uL   Lymphocytes Relative 14 %   Lymphs Abs 1.8 0.7 - 4.0 K/uL   Monocytes Relative 6 %   Monocytes Absolute 0.8 0.1 - 1.0 K/uL   Eosinophils Relative 3 %   Eosinophils Absolute 0.4 0.0 - 0.7 K/uL   Basophils Relative 0 %   Basophils Absolute 0.0 0.0 - 0.1 K/uL    Comment: Performed at [facility], [place] [address], [place], [place] [address]  Sedimentation rate     Status: None   Collection Time: 10/26/17  6:14 AM  Result Value Ref Range   Sed Rate 15 0 - 16 mm/hr    Comment: Performed at [facility], [place] [address], [place], [place] [address]  Hemoglobin A1c     Status: Abnormal   Collection Time: 10/26/17  6:14 AM  Result Value Ref Range   Hgb A1c MFr Bld 5.8 (H) 4.8 - 5.6 %    Comment: (NOTE) Pre diabetes:          5.7%-6.4% Diabetes:              >6.4% Glycemic control for   <7.0% adults with diabetes    Mean Plasma Glucose 119.76 mg/dL    Comment: Performed at [place] [address] [place], [place] [address]  I-Stat CG4 Lactic Acid, ED     Status: None   Collection Time: 10/26/17  6:24 AM  Result Value Ref Range   Lactic Acid, Venous 0.97 0.5 - 1.9 mmol/L   Dg Hand Complete Right  Result Date: 10/26/2017 CLINICAL DATA:  Pain and swelling in the right hand for 3 days. Pus oozing from the thumbnail. EXAM: RIGHT HAND - COMPLETE 3+ VIEW COMPARISON:  Right first finger 09/08/2017 FINDINGS: Diffuse soft tissue swelling predominantly involving the first finger and dorsal aspect of the right hand. Suggestion of tiny foreign material projected over the distal right first and second fingers. This could represent superficial contamination or soft tissue foreign bodies. No soft tissue gas. No bone destruction or cortical erosion. No acute fracture or dislocation. IMPRESSION: Diffuse soft tissue swelling of the right hand predominantly involving the right first finger and dorsum. Foreign bodies versus surface contamination over the distal first and second fingers. No evidence of osteomyelitis or acute bony injury. Electronically Signed   By: Lucienne Capers M.D.   On: 10/26/2017 05:58    Pending Labs Unresulted Labs (From admission, onward)   Start     Ordered   10/27/17 5852  Basic metabolic panel  Tomorrow morning,   R     10/26/17 1008   10/27/17 0500  CBC  Tomorrow morning,   R     10/26/17 1008   10/27/17 0500  Creatinine, serum  Daily,   R     10/26/17 1040   10/26/17 1006  HIV antibody (Routine Testing)  Once,   R     10/26/17 1008   10/26/17 1004  MRSA PCR Screening  Once,   R     10/26/17 1003   10/26/17 0534  Culture, blood (routine x 2)  BLOOD CULTURE X 2,   STAT     10/26/17 0533      Vitals/Pain Today's Vitals   10/26/17 1244 10/26/17 1244 10/26/17 1245 10/26/17 1255  BP:  (!) 166/88 Marland Kitchen)  172/109 (!) 168/107  Pulse:  98  98  Resp:    20  Temp:  99.2 F (37.3 C)    TempSrc:  Oral    SpO2:  99%  99%  Weight:      Height:      PainSc: 4         Isolation Precautions No active isolations  Medications Medications  dextrose 5 %-0.45 % sodium chloride infusion ( Intravenous New Bag/Given 10/26/17 1219)  nicotine (NICODERM CQ - dosed in mg/24 hours) patch 21 mg (21 mg Transdermal Patch Applied 10/26/17 1218)  chlorhexidine (HIBICLENS) 4 % liquid 4 application (has no administration in time range)  povidone-iodine 10 % swab 2 application (has no administration in time range)  albuterol (PROVENTIL HFA;VENTOLIN HFA) 108 (90 Base) MCG/ACT inhaler 2 puff (has no administration in time range)  sodium chloride flush (NS) 0.9 % injection 3 mL (3 mLs Intravenous Given 10/26/17 1227)  acetaminophen (TYLENOL) tablet 1,000 mg (1,000 mg Oral Not Given 10/26/17 1120)  ketorolac (TORADOL) 30 MG/ML injection 30 mg (has no administration in time range)  diphenhydrAMINE (BENADRYL) capsule 25 mg (has no administration in time range)  polyethylene glycol (MIRALAX / GLYCOLAX) packet 17 g (has no administration in time range)  senna (SENOKOT) tablet 8.6 mg (8.6 mg Oral Given 10/26/17 1250)  bisacodyl (DULCOLAX) EC tablet 5 mg (has no administration in time range)  magnesium citrate solution 1 Bottle (has no administration in time range)  ondansetron (ZOFRAN) tablet 4 mg (has no administration in time range)    Or  ondansetron (ZOFRAN) injection 4 mg (has no administration in time range)  vancomycin (VANCOCIN) IVPB 1000 mg/200 mL premix (has no administration in time range)  piperacillin-tazobactam (ZOSYN) IVPB 3.375 g (has no administration in time range)  LORazepam (ATIVAN) injection 1 mg (1 mg Intramuscular Given 10/26/17 1049)  HYDROmorphone (DILAUDID) injection 2 mg (has no administration in time range)  morphine 4 MG/ML injection 4 mg (4 mg Intravenous Given 10/26/17 0619)  diphenhydrAMINE (BENADRYL) injection 25 mg (25 mg Intravenous Given 10/26/17 0620)  ceFAZolin (ANCEF) IVPB 1 g/50 mL premix (0 g Intravenous Stopped 10/26/17 0741)  sodium chloride 0.9 %  bolus 1,000 mL (0 mLs Intravenous Stopped 10/26/17 0748)  HYDROmorphone (DILAUDID) injection 1 mg (1 mg Intravenous Given 10/26/17 0742)  ondansetron (ZOFRAN) injection 4 mg (4 mg Intravenous Given 10/26/17 0742)  diphenhydrAMINE (BENADRYL) injection 25 mg (25 mg Intravenous Given 10/26/17 0818)  ketorolac (TORADOL) 30 MG/ML injection 30 mg (30 mg Intravenous Given 10/26/17 1027)  acetaminophen (TYLENOL) tablet 1,000 mg (1,000 mg Oral Given 10/26/17 1015)  piperacillin-tazobactam (ZOSYN) IVPB 3.375 g (0 g Intravenous Stopped 10/26/17 1244)  vancomycin (VANCOCIN) IVPB 1000 mg/200 mL premix (0 mg Intravenous Stopped 10/26/17 1243)    Mobility ambulatory

## 2017-10-26 NOTE — ED Notes (Signed)
[facility] called for transport. 

## 2017-10-26 NOTE — Progress Notes (Addendum)
Received patient from PACU with compression wrap and carter arm foam on right hand to keep it elevated.  VS stable, O2Sat at 99% on RA, drowsy but easily arousable, pain at 1/10, able to move right fingers except thumb.  Will monitor.

## 2017-10-26 NOTE — Transfer of Care (Signed)
Immediate Anesthesia Transfer of Care Note  Patient: [name]  Procedure(s) Performed: IRRIGATION AND DEBRIDEMENT HAND (Right Hand)  Patient Location: PACU  Anesthesia Type:General  Level of Consciousness: sedated  Airway & Oxygen Therapy: Patient connected to face mask oxygen  Post-op Assessment: Report given to RN and Post -op Vital signs reviewed and stable  Post vital signs: Reviewed and stable  Last Vitals:  Vitals Value Taken Time  BP 142/99 10/26/2017  8:54 PM  Temp    Pulse 101 10/26/2017  8:56 PM  Resp 16 10/26/2017  8:56 PM  SpO2 100 % 10/26/2017  8:56 PM  Vitals shown include unvalidated device data.  Last Pain:  Vitals:   10/26/17 1713  TempSrc: Oral  PainSc:          Complications: No apparent anesthesia complications

## 2017-10-26 NOTE — ED Notes (Addendum)
Pt c/o increasing R hand pain x 3 days.  Pain score 10/10.  Hand noted to be swollen, discolored(gray/ashen), and peeling.  Pt reports "it feels like I have a million fire ants crawling on it."  Pt cussing and writhing in bed, but is apologetic after care is provided.

## 2017-10-26 NOTE — Progress Notes (Signed)
Pharmacy Antibiotic Note  [name] is a 42 y.o. male admitted on 10/26/2017 with cellulitis of hand.  Planning I&D later today.  Pharmacy has been consulted for Vancomycin & Zosyn dosing.  Plan: Zosyn 3.375gm IV Q8h to be infused over 4hrs Vancomycin 1gm IV q12h to target AUC 400-550 Daily Scr Monitor renal function and cx data   Height: 5\' 7"  (170.2 cm) Weight: 150 lb (68 kg) IBW/kg (Calculated) : 66.1  Temp (24hrs), Avg:99.1 F (37.3 C), Min:99.1 F (37.3 C), Max:99.1 F (37.3 C)  Recent Labs  Lab 10/26/17 0614 10/26/17 0624  WBC 13.1*  --   CREATININE 0.94  --   LATICACIDVEN  --  0.97    Estimated Creatinine Clearance: 97.7 mL/min (by C-G formula based on SCr of 0.94 mg/dL).    No Known Allergies  Antimicrobials this admission: 4/5 Ancef x1 in ED 4/5 Zosyn>> 4/5 Vanc>>  Dose adjustments this admission:  Microbiology results: 4/5 BCx:  MRSA PCR:   Thank you for allowing pharmacy to be a part of this patient's care.  Elson ClanLilliston, [name] 10/26/2017 10:06 AM

## 2017-10-26 NOTE — Anesthesia Procedure Notes (Signed)
Procedure Name: Intubation Date/Time: 10/26/2017 6:54 PM Performed by: Molli HazardGordon, Simran Mannis M, CRNA Pre-anesthesia Checklist: Patient identified, Emergency Drugs available, Suction available and Patient being monitored Patient Re-evaluated:Patient Re-evaluated prior to induction Oxygen Delivery Method: Circle system utilized Preoxygenation: Pre-oxygenation with 100% oxygen Induction Type: IV induction and Rapid sequence Laryngoscope Size: Miller and 2 Grade View: Grade I Tube type: Oral Tube size: 7.5 mm Number of attempts: 1 Airway Equipment and Method: Stylet Placement Confirmation: ETT inserted through vocal cords under direct vision,  positive ETCO2 and breath sounds checked- equal and bilateral Secured at: 23 cm Tube secured with: Tape Dental Injury: Teeth and Oropharynx as per pre-operative assessment

## 2017-10-27 ENCOUNTER — Encounter (HOSPITAL_COMMUNITY): Payer: Self-pay | Admitting: Orthopedic Surgery

## 2017-10-27 LAB — CBC
HCT: 35.8 % — ABNORMAL LOW (ref 39.0–52.0)
Hemoglobin: 11.5 g/dL — ABNORMAL LOW (ref 13.0–17.0)
MCH: 28.3 pg (ref 26.0–34.0)
MCHC: 32.1 g/dL (ref 30.0–36.0)
MCV: 88.2 fL (ref 78.0–100.0)
PLATELETS: 249 10*3/uL (ref 150–400)
RBC: 4.06 MIL/uL — ABNORMAL LOW (ref 4.22–5.81)
RDW: 14 % (ref 11.5–15.5)
WBC: 15.2 10*3/uL — AB (ref 4.0–10.5)

## 2017-10-27 LAB — BASIC METABOLIC PANEL
Anion gap: 10 (ref 5–15)
BUN: 8 mg/dL (ref 6–20)
CHLORIDE: 104 mmol/L (ref 101–111)
CO2: 23 mmol/L (ref 22–32)
CREATININE: 1.02 mg/dL (ref 0.61–1.24)
Calcium: 8.5 mg/dL — ABNORMAL LOW (ref 8.9–10.3)
GFR calc Af Amer: >60 mL/min (ref >60)
GFR calc non Af Amer: >60 mL/min (ref >60)
Glucose, Bld: 153 mg/dL — ABNORMAL HIGH (ref 65–99)
Potassium: 3.6 mmol/L (ref 3.5–5.1)
SODIUM: 137 mmol/L (ref 135–145)

## 2017-10-27 MED ORDER — WHITE PETROLATUM EX OINT
TOPICAL_OINTMENT | CUTANEOUS | Status: AC
  Administered 2017-10-27: 0.2
  Filled 2017-10-27: qty 28.35

## 2017-10-27 NOTE — Op Note (Signed)
NAMEDIESEL, LINA NO.:  [id number]  MEDICAL RECORD NO.:  [id number]  LOCATION:  6N30C                        FACILITY:  [facility]  PHYSICIAN:  Dionne Ano. Jesse Nosbisch, M.D.DATE OF BIRTH:  [birth year]  DATE OF PROCEDURE: DATE OF DISCHARGE:                              OPERATIVE REPORT   PREOPERATIVE DIAGNOSES:  Right thumb abscess, deep in nature and extensive.  Right infectious tenosynovitis about the FPL and carpal canal contents.  Right acute carpal tunnel syndrome.  Right hand massive infection notable about the tendon sheath and soft tissues.  POSTOPERATIVE DIAGNOSES:  Right thumb abscess, deep in nature and extensive.  Right infectious tenosynovitis about the FPL and carpal canal contents.  Right acute carpal tunnel syndrome.  Right hand massive infection notable about the tendon sheath and soft tissues.  PROCEDURE: 1. Irrigation and debridement of deep abscess, right thumb pulp. 2. Infectious flexor tenosynovitis, decompression about the thumb FPL. 3. Extensive neurolysis, radial and ulnar digital nerves, right thumb. 4. Right open carpal tunnel release. 5. Palmaris longus flexor tenotomy. 6. Extensive tenolysis tenosynovectomy, radical in nature, distal     forearm, wrist, and portions of the hand. 7. Decompression, mid palmar space abscess. 8. Fasciotomy, hand including the thenar space and the first dorsal     interosseous region.  SURGEON:  Dionne Ano. Amanda Pea, MD.  ASSISTANT:  None.  COMPLICATIONS:  None.  ANESTHESIA:  General.  TOURNIQUET TIME:  Less than an hour.  INDICATIONS:  This patient is a 42 year old male, who presented to the emergency room.  He has had nearly a month long history of an infection. His hand is 5 times normal size compared to the opposite hand and he is in exquisite pain and acting somewhat unusually due to the pain issues. He states that his hand has numbness throughout it.  He holds the hand in a guarded posture and  states that he has tremendous difficulty with any sense of touch about the entire hand.  His examination is quite difficult.  Notes from his prior visits to the [facility] will reflect visits over a month ago for a similar infection.  At present time, his x-rays do not show any lytic features about the bony architecture to suggest osteomyelitis, but certainly he has one of the rather most advanced infectious processes one can see in the hand.  I have counseled him to the best of my abilities in regard to our plans, concerns, and treatment algorithms.  He desires to proceed with surgery.  DESCRIPTION OF PROCEDURE:  The patient was seen by myself and Anesthesia, underwent a general anesthetic, was prepped and draped in usual sterile fashion with Betadine scrub and paint.  Time-out was observed and following this, the patient then underwent evaluation of the hand.  I first and foremost evaluated the hand and noted the area of obvious purulence about the distal pole.  I opened this in a Brunner incision fashion.  I very carefully and cautiously opened this without difficulty and immediately encountered de-epithelialized skin tissue as well as deep abscess going down to the periosteum.  The periosteum was encroached upon as was the flexor pollicis longus.  I cultured  this for aerobic and anaerobic cultures.  At this time, I wanted to see the extent of the patient's thenar and first dorsal compartment issues and thus I made a small incision in the thenar web space/thenar region dissected down and performed a fasciotomy.  This was a thenar fasciotomy without difficulty.  The patient tolerated this well.  Following this, additional hand fasciotomy was accomplished dorsally about the dorsal interosseous through a 1-2 cm incision.  Thus, a hand fasciotomy about the thenar region including the thenar space and the first dorsal interosseous region was accomplished  without difficulty.  Following this, I extended my Brunner incision, opened skin flaps up, and given the large amount of de-epithelialized and necrotic tissue deep and superficial, I had a facial nerve dissector used to perform an extensive radial digital nerve neurolysis and ulnar digital nerve neurolysis.  The patient tolerated this well.  There were no complicating features.  Following extensive neurolysis, I then identified the FPL and performed a radical tenolysis tenosynovectomy.  I preserved the oblique pulley as best as possible; however, a large amount of the pulley system was encroached upon and necrotic.  I performed a very careful and cautious I and D with flexor pollicis longus tenolysis tenosynovectomy about the thumb as well as the radial digital nerve and ulnar digital nerve neurolysis.  I irrigated copiously at this time. Following this, it was very notable that the patient had tracking of the pus into the carpal canal which likely accounts for the carpal tunnel acute symptoms.  I thus made an extended open carpal tunnel release incision.  Dissection was carried down and the palmaris longus tenotomy was accomplished.  The palmaris longus is a flexor tendon.  This was pulled distally, severed, and allowed to retract proximally.  I then performed a fasciotomy of the volar forearm and an open carpal tunnel release.  The patient tolerated this well.  I released carpal tunnel off the ulnar ledge.  Following this, I then performed radical flexor tenolysis tenosynovectomy including FPL and FDP to the index, middle, ring, and small finger and FDS to the middle, ring, small, and index finger.  This was a radical flexor tenosynovectomy about the distal forearm, carpal tunnel region, and hand.  This was a distinct and separate portion of the procedure outside of the simple carpal tunnel release with median nerve neurolysis.  The patient tolerated this well.  I then identified  and made sure we decompressed the mid palmar space.  Mid palmar space I and D was accomplished without difficulty and there were no complicating features.  Following this, I placed 6 L of saline through and through the areas and open wounds.  In total, there was 4 incisions separately performed.  I placed a drain from the carpal tunnel exiting the thumb, traveling through the flexor tunnel.  The patient had Mepitel followed by Xeroform and Adaptic placed and the wounds were left wide open with plans to return Sunday for repeat irrigation and debridement.  Elevation, vancomycin and Zosyn, and await cultures will be the rule. This patient will likely require series of irrigation and debridement to try and afford him some semblance of restoration of function and an improved outcome in terms of this radical infection.  All questions have been encouraged and answered and do's and don'ts discussed.     Dionne AnoWilliam M. Amanda PeaGramig, M.D.     [facility] Hospital[facility]G/MEDQ  D:  10/26/2017  T:  10/27/2017  Job:  [id number]

## 2017-10-27 NOTE — Progress Notes (Signed)
Nutrition Brief Note  Patient identified on the Malnutrition Screening Tool (MST) Report  Wt Readings from Last 15 Encounters:  10/26/17 150 lb (68 kg)  09/08/17 136 lb (61.7 kg)  11/08/16 155 lb (70.3 kg)  11/01/16 156 lb (70.8 kg)  10/23/16 152 lb (68.9 kg)  08/03/15 158 lb 6 oz (71.8 kg)  01/18/14 162 lb (73.5 kg)  09/29/11 160 lb (72.6 kg)    Body mass index is 23.49 kg/m. Patient meets criteria for normal weight based on current BMI. R arm incision from 4/5.  Pt admitted for pain and swelling of R hand that began 3 days PTA and progressively worsened. He had extensive I&D to area yesterday evening and plan for repeat I&D tomorrow.  Current diet order is Regular and patient consumed 100% of breakfast (710 kcal, 31 grams of protein) this AM.  Medications reviewed; 100 mg Colace BID, 20 mg Pepcid/day, 1 packet Miralax/day, 1 tablet Senokot BID, 1000 mg ascorbic acid/day.  Labs reviewed; Ca: 8.5 mg/dL.   No nutrition interventions warranted at this time. If nutrition issues arise, please consult RD.      Trenton GammonJessica Abeer Deskins, MS, RD, LDN, [facility] Center[facility]SC Inpatient Clinical Dietitian Pager # [phone] After hours/weekend pager # [phone]

## 2017-10-27 NOTE — Plan of Care (Signed)
  Problem: Clinical Measurements: Goal: Ability to maintain clinical measurements within normal limits will improve Outcome: Progressing   Problem: Pain Managment: Goal: General experience of comfort will improve Outcome: Progressing   

## 2017-10-27 NOTE — Progress Notes (Signed)
[facility]                                                                              Patient Demographics  [name], is a 42 y.o. male, DOB - Aug 28, 1975, ZOX:[id number]  Admit date - 10/26/2017   Admitting Physician Renae FickleMackenzie Short, MD  Outpatient Primary MD for the patient is Renaye RakersBland, Veita, MD  Outpatient specialists:   LOS - 1  days    Chief Complaint  Patient presents with  . Hand Injury       Brief summary  [name] is a 42 y.o. male with history of asthma and kidney stones who presents with right hand pain and admitted for right hand soft tissue infection      Assessment & Plan    Principal Problem:   Hand abscess Active Problems:   Asthma   Cigarette nicotine dependence with withdrawal  #1 right hand suggestive of infection: Status post incision and drainage by orthopedist 10/26/2017 Pain control IV antibiotics Elevate the affected extremity Follow-up cultures A1c 5.8% HIV pending Orthopedics consulted  Code Status: Full code DVT Prophylaxis:  SCD's Family Communication: Discussed in detail with the patient, all imaging results, lab results explained to the patient    Disposition Plan: Home  Time Spent in minutes   35minutes  Procedures:  Incision and drainage with irrigation  Consultants: Orthopedist  Antimicrobials:      Medications  Scheduled Meds: . acetaminophen  1,000 mg Oral TID  . docusate sodium  100 mg Oral BID  . famotidine  20 mg Oral Daily  . ketorolac  30 mg Intravenous Q8H  . nicotine  21 mg Transdermal Daily  . polyethylene glycol  17 g Oral Daily  . senna  1 tablet Oral BID  . sodium chloride flush  3 mL Intravenous Q12H  . vitamin C  1,000 mg Oral Daily   Continuous Infusions: . methocarbamol (ROBAXIN)  IV Stopped (10/27/17 0130)  . piperacillin-tazobactam (ZOSYN)  IV Stopped (10/27/17 1934)  . vancomycin Stopped (10/27/17 0949)   PRN Meds:.albuterol, ALPRAZolam, bisacodyl,  diphenhydrAMINE, HYDROmorphone (DILAUDID) injection, LORazepam, magnesium citrate, methocarbamol (ROBAXIN)  IV, ondansetron **OR** ondansetron (ZOFRAN) IV, oxyCODONE   Antibiotics   Anti-infectives (From admission, onward)   Start     Dose/Rate Route Frequency Ordered Stop   10/27/17 0600  vancomycin (VANCOCIN) IVPB 1000 mg/200 mL premix  Status:  Discontinued     1,000 mg 200 mL/hr over 60 Minutes Intravenous On call to O.R. 10/26/17 1756 10/26/17 2224   10/26/17 2200  vancomycin (VANCOCIN) IVPB 1000 mg/200 mL premix     1,000 mg 200 mL/hr over 60 Minutes Intravenous Every 12 hours 10/26/17 1040     10/26/17 1800  piperacillin-tazobactam (ZOSYN) IVPB 3.375 g     3.375 g 12.5 mL/hr over 240 Minutes Intravenous Every 8 hours 10/26/17 1040     10/26/17 1757  vancomycin (VANCOCIN) 1-5 GM/200ML-% IVPB    Note to Pharmacy:  Lorenda IshiharaGibbs, Bonnie   : cabinet override      10/26/17 1757 10/26/17 1947   10/26/17 1015  piperacillin-tazobactam (ZOSYN) IVPB 3.375 g     3.375  g 100 mL/hr over 30 Minutes Intravenous  Once 10/26/17 1006 10/26/17 1244   10/26/17 1015  vancomycin (VANCOCIN) IVPB 1000 mg/200 mL premix     1,000 mg 200 mL/hr over 60 Minutes Intravenous  Once 10/26/17 1006 10/26/17 1243   10/26/17 0545  ceFAZolin (ANCEF) IVPB 1 g/50 mL premix     1 g 100 mL/hr over 30 Minutes Intravenous  Once 10/26/17 0533 10/26/17 0741        Subjective:   [name] was seen and examined today.   Patient denies dizziness, chest pain, shortness of breath, abdominal pain, N/V/D/C, new weakness, numbess, tingling. No acute events overnight.    Objective:   Vitals:   10/26/17 2200 10/26/17 2215 10/27/17 0530 10/27/17 1334  BP: (!) 134/91  110/64 115/73  Pulse: 94  99 (!) 101  Resp: 14  18   Temp:  98 F (36.7 C) 99.9 F (37.7 C) 98.3 F (36.8 C)  TempSrc:  Oral Oral Oral  SpO2: 99%  98% 100%  Weight:      Height:        Intake/Output Summary (Last 24 hours) at 10/27/2017 1955 Last data  filed at 10/27/2017 1309 Gross per 24 hour  Intake 1404.5 ml  Output 2675 ml  Net -1270.5 ml     Wt Readings from Last 3 Encounters:  10/26/17 68 kg (150 lb)  09/08/17 61.7 kg (136 lb)  11/08/16 70.3 kg (155 lb)     Exam  General: NAD  HEENT: NCAT,  PERRL,MMM  Neck: SUPPLE, (-) JVD  Cardiovascular: RRR, (-) GALLOP, (-) MURMUR  Respiratory: CTA  Gastrointestinal: SOFT, (-) DISTENSION, BS(+), (_) TENDERNESS  Ext: (-) CYANOSIS, postoperative dressing change for affected extremity with elevated in a cast   neuro: A, OX 3  Skin:(-) RASH  Psych:NORMAL AFFECT/MOOD   Data Reviewed:  I have personally reviewed following labs and imaging studies  Micro Results Recent Results (from the past 240 hour(s))  Culture, blood (routine x 2)     Status: None (Preliminary result)   Collection Time: 10/26/17  6:01 AM  Result Value Ref Range Status   Specimen Description   Final    BLOOD LEFT ANTECUBITAL Performed at [facility], [address] [place], [place] [address]    Special Requests   Final    BOTTLES DRAWN AEROBIC AND ANAEROBIC Blood Culture adequate volume Performed at [facility], [address] [place], [place] [address]    Culture   Final    NO GROWTH 1 DAY Performed at [facility], [address], [place], [place] [address]    Report Status PENDING  Incomplete  Culture, blood (routine x 2)     Status: None (Preliminary result)   Collection Time: 10/26/17  6:15 AM  Result Value Ref Range Status   Specimen Description   Final    BLOOD LEFT HAND Performed at [facility], [address], [place], [place] [address]    Special Requests   Final    BOTTLES DRAWN AEROBIC AND ANAEROBIC Blood Culture results may not be optimal due to an inadequate volume of blood received in culture bottles Performed at [facility], [address], [place], [place] [address]    Culture   Final     NO GROWTH 1 DAY Performed at [facility], [address], [place], [place] [address]    Report Status PENDING  Incomplete  MRSA PCR Screening  Status: None   Collection Time: 10/26/17  5:51 PM  Result Value Ref Range Status   MRSA by PCR NEGATIVE NEGATIVE Final    Comment:        The GeneXpert MRSA Assay (FDA approved for NASAL specimens only), is one component of a comprehensive MRSA colonization surveillance program. It is not intended to diagnose MRSA infection nor to guide or monitor treatment for MRSA infections. Performed at [facility], [address] [place], [place] [address]   Aerobic/Anaerobic Culture (surgical/deep wound)     Status: None (Preliminary result)   Collection Time: 10/26/17  7:29 PM  Result Value Ref Range Status   Specimen Description ABSCESS RIGHT HAND  Final   Special Requests PATIENT ON FOLLOWING ZOSYN  Final   Gram Stain   Final    ABUNDANT WBC PRESENT,BOTH PMN AND MONONUCLEAR FEW GRAM POSITIVE COCCI    Culture   Final    CULTURE REINCUBATED FOR BETTER GROWTH Performed at [facility], [address] [place], [place] [address]    Report Status PENDING  Incomplete    Radiology Reports Dg Hand Complete Right  Result Date: 10/26/2017 CLINICAL DATA:  Pain and swelling in the right hand for 3 days. Pus oozing from the thumbnail. EXAM: RIGHT HAND - COMPLETE 3+ VIEW COMPARISON:  Right first finger 09/08/2017 FINDINGS: Diffuse soft tissue swelling predominantly involving the first finger and dorsal aspect of the right hand. Suggestion of tiny foreign material projected over the distal right first and second fingers. This could represent superficial contamination or soft tissue foreign bodies. No soft tissue gas. No bone destruction or cortical erosion. No acute fracture or dislocation. IMPRESSION: Diffuse soft tissue swelling of the right hand predominantly involving the right first finger and dorsum. Foreign bodies versus surface  contamination over the distal first and second fingers. No evidence of osteomyelitis or acute bony injury. Electronically Signed   By: Burman Nieves M.D.   On: 10/26/2017 05:58    Lab Data:  CBC: Recent Labs  Lab 10/26/17 0614 10/27/17 0529  WBC 13.1* 15.2*  NEUTROABS 10.2*  --   HGB 13.6 11.5*  HCT 41.5 35.8*  MCV 88.7 88.2  PLT 288 249   Basic Metabolic Panel: Recent Labs  Lab 10/26/17 0614 10/27/17 0529  NA 138 137  K 3.6 3.6  CL 103 104  CO2 24 23  GLUCOSE 99 153*  BUN 11 8  CREATININE 0.94 1.02  CALCIUM 9.8 8.5*   GFR: Estimated Creatinine Clearance: 90 mL/min (by C-G formula based on SCr of 1.02 mg/dL). Liver Function Tests: Recent Labs  Lab 10/26/17 0614  AST 21  ALT 17  ALKPHOS 115  BILITOT 0.6  PROT 7.5  ALBUMIN 4.1   No results for input(s): LIPASE, AMYLASE in the last 168 hours. No results for input(s): AMMONIA in the last 168 hours. Coagulation Profile: No results for input(s): INR, PROTIME in the last 168 hours. Cardiac Enzymes: No results for input(s): CKTOTAL, CKMB, CKMBINDEX, TROPONINI in the last 168 hours. BNP (last 3 results) No results for input(s): PROBNP in the last 8760 hours. HbA1C: Recent Labs    10/26/17 0614  HGBA1C 5.8*   CBG: No results for input(s): GLUCAP in the last 168 hours. Lipid Profile: No results for input(s): CHOL, HDL, LDLCALC, TRIG, CHOLHDL, LDLDIRECT in the last 72 hours. Thyroid Function Tests: No results for input(s): TSH, T4TOTAL, FREET4, T3FREE, THYROIDAB in the last 72 hours. Anemia Panel: No results for input(s): VITAMINB12,  FOLATE, FERRITIN, TIBC, IRON, RETICCTPCT in the last 72 hours. Urine analysis:    Component Value Date/Time   COLORURINE YELLOW 08/03/2015 0040   APPEARANCEUR CLEAR 08/03/2015 0040   LABSPEC 1.025 08/03/2015 0040   PHURINE 6.0 08/03/2015 0040   GLUCOSEU NEGATIVE 08/03/2015 0040   HGBUR NEGATIVE 08/03/2015 0040   BILIRUBINUR NEGATIVE 08/03/2015 0040   KETONESUR NEGATIVE  08/03/2015 0040   PROTEINUR NEGATIVE 08/03/2015 0040   UROBILINOGEN 1.0 01/18/2014 1310   NITRITE NEGATIVE 08/03/2015 0040   LEUKOCYTESUR MODERATE (A) 08/03/2015 0040     Jackie Plum M.D. [facility] 10/27/2017, 7:55 PM  Pager: [phone] Between 7am to 7pm - call Pager - [phone]  After 7pm go to [email or web address] - password [email or web address]  Call night coverage person covering after 7pm

## 2017-10-28 ENCOUNTER — Inpatient Hospital Stay (HOSPITAL_COMMUNITY): Payer: Self-pay | Admitting: Certified Registered Nurse Anesthetist

## 2017-10-28 ENCOUNTER — Encounter (HOSPITAL_COMMUNITY)
Admission: EM | Discharge status: Against Medical Advice | Payer: Self-pay | Source: Home / Self Care | Attending: Internal Medicine

## 2017-10-28 HISTORY — PX: I&D EXTREMITY: SHX5045

## 2017-10-28 LAB — CBC
HCT: 33.9 % — ABNORMAL LOW (ref 39.0–52.0)
Hemoglobin: 10.8 g/dL — ABNORMAL LOW (ref 13.0–17.0)
MCH: 27.9 pg (ref 26.0–34.0)
MCHC: 31.9 g/dL (ref 30.0–36.0)
MCV: 87.6 fL (ref 78.0–100.0)
Platelets: 257 10*3/uL (ref 150–400)
RBC: 3.87 MIL/uL — ABNORMAL LOW (ref 4.22–5.81)
RDW: 14 % (ref 11.5–15.5)
WBC: 11 10*3/uL — AB (ref 4.0–10.5)

## 2017-10-28 LAB — BASIC METABOLIC PANEL
ANION GAP: 11 (ref 5–15)
BUN: 12 mg/dL (ref 6–20)
CALCIUM: 8.4 mg/dL — AB (ref 8.9–10.3)
CO2: 24 mmol/L (ref 22–32)
Chloride: 106 mmol/L (ref 101–111)
Creatinine, Ser: 1.11 mg/dL (ref 0.61–1.24)
GFR calc Af Amer: >60 mL/min (ref >60)
GFR calc non Af Amer: >60 mL/min (ref >60)
GLUCOSE: 96 mg/dL (ref 65–99)
Potassium: 4 mmol/L (ref 3.5–5.1)
Sodium: 141 mmol/L (ref 135–145)

## 2017-10-28 SURGERY — IRRIGATION AND DEBRIDEMENT EXTREMITY
Anesthesia: General | Site: Arm Lower | Laterality: Right

## 2017-10-28 MED ORDER — ONDANSETRON HCL 4 MG/2ML IJ SOLN
INTRAMUSCULAR | Status: AC
  Filled 2017-10-28: qty 2

## 2017-10-28 MED ORDER — PROPOFOL 10 MG/ML IV BOLUS
INTRAVENOUS | Status: AC
  Filled 2017-10-28: qty 20

## 2017-10-28 MED ORDER — LACTATED RINGERS IV SOLN
INTRAVENOUS | Status: DC | PRN
  Administered 2017-10-28: 07:00:00 via INTRAVENOUS

## 2017-10-28 MED ORDER — DEXAMETHASONE SODIUM PHOSPHATE 10 MG/ML IJ SOLN
INTRAMUSCULAR | Status: DC | PRN
  Administered 2017-10-28: 6 mg via INTRAVENOUS

## 2017-10-28 MED ORDER — HYDROMORPHONE HCL 1 MG/ML IJ SOLN
INTRAMUSCULAR | Status: AC
  Administered 2017-10-28: 0.5 mg via INTRAVENOUS
  Filled 2017-10-28: qty 1

## 2017-10-28 MED ORDER — MIDAZOLAM HCL 2 MG/2ML IJ SOLN
INTRAMUSCULAR | Status: AC
  Filled 2017-10-28: qty 2

## 2017-10-28 MED ORDER — LIDOCAINE 2% (20 MG/ML) 5 ML SYRINGE
INTRAMUSCULAR | Status: DC | PRN
Start: 2017-10-28 — End: 2017-10-28
  Administered 2017-10-28: 100 mg via INTRAVENOUS

## 2017-10-28 MED ORDER — FENTANYL CITRATE (PF) 250 MCG/5ML IJ SOLN
INTRAMUSCULAR | Status: AC
  Filled 2017-10-28: qty 5

## 2017-10-28 MED ORDER — 0.9 % SODIUM CHLORIDE (POUR BTL) OPTIME
TOPICAL | Status: DC | PRN
  Administered 2017-10-28: 1000 mL

## 2017-10-28 MED ORDER — SODIUM CHLORIDE 0.9 % IR SOLN
Status: DC | PRN
  Administered 2017-10-28: 6000 mL

## 2017-10-28 MED ORDER — PROMETHAZINE HCL 25 MG/ML IJ SOLN: 6.2500 mg | INTRAMUSCULAR | Status: DC | PRN

## 2017-10-28 MED ORDER — PROPOFOL 10 MG/ML IV BOLUS
INTRAVENOUS | Status: DC | PRN
  Administered 2017-10-28: 170 mg via INTRAVENOUS

## 2017-10-28 MED ORDER — BUPIVACAINE HCL (PF) 0.25 % IJ SOLN
INTRAMUSCULAR | Status: AC
  Filled 2017-10-28: qty 30

## 2017-10-28 MED ORDER — DEXAMETHASONE SODIUM PHOSPHATE 10 MG/ML IJ SOLN
INTRAMUSCULAR | Status: AC
  Filled 2017-10-28: qty 1

## 2017-10-28 MED ORDER — FENTANYL CITRATE (PF) 250 MCG/5ML IJ SOLN
INTRAMUSCULAR | Status: DC | PRN
  Administered 2017-10-28 (×6): 25 ug via INTRAVENOUS

## 2017-10-28 MED ORDER — ONDANSETRON HCL 4 MG/2ML IJ SOLN
INTRAMUSCULAR | Status: DC | PRN
  Administered 2017-10-28: 4 mg via INTRAVENOUS

## 2017-10-28 MED ORDER — HYDROMORPHONE HCL 1 MG/ML IJ SOLN
0.2500 mg | INTRAMUSCULAR | Status: DC | PRN
  Administered 2017-10-28 (×4): 0.5 mg via INTRAVENOUS

## 2017-10-28 MED ORDER — MIDAZOLAM HCL 2 MG/2ML IJ SOLN
INTRAMUSCULAR | Status: DC | PRN
  Administered 2017-10-28: 2 mg via INTRAVENOUS

## 2017-10-28 SURGICAL SUPPLY — 46 items
BANDAGE ACE 4X5 VEL STRL LF (GAUZE/BANDAGES/DRESSINGS) ×3 IMPLANT
BNDG CONFORM 2 STRL LF (GAUZE/BANDAGES/DRESSINGS) IMPLANT
BNDG GAUZE ELAST 4 BULKY (GAUZE/BANDAGES/DRESSINGS) ×3 IMPLANT
CAST PADDING SYN 3 (CAST SUPPLIES) ×2 IMPLANT
CORDS BIPOLAR (ELECTRODE) ×3 IMPLANT
COVER SURGICAL LIGHT HANDLE (MISCELLANEOUS) ×3 IMPLANT
CUFF TOURNIQUET SINGLE 18IN (TOURNIQUET CUFF) ×3 IMPLANT
CUFF TOURNIQUET SINGLE 24IN (TOURNIQUET CUFF) IMPLANT
DRSG ADAPTIC 3X8 NADH LF (GAUZE/BANDAGES/DRESSINGS) ×3 IMPLANT
DRSG MEPITEL 3X4 ME34 (GAUZE/BANDAGES/DRESSINGS) ×4 IMPLANT
GAUZE SPONGE 4X4 12PLY STRL (GAUZE/BANDAGES/DRESSINGS) ×3 IMPLANT
GAUZE SPONGE 4X4 16PLY XRAY LF (GAUZE/BANDAGES/DRESSINGS) ×2 IMPLANT
GAUZE XEROFORM 1X8 LF (GAUZE/BANDAGES/DRESSINGS) ×3 IMPLANT
GLOVE BIOGEL M 8.0 STRL (GLOVE) ×3 IMPLANT
GLOVE SS BIOGEL STRL SZ 8 (GLOVE) ×1 IMPLANT
GLOVE SUPERSENSE BIOGEL SZ 8 (GLOVE) ×2
GOWN STRL REUS W/ TWL LRG LVL3 (GOWN DISPOSABLE) ×1 IMPLANT
GOWN STRL REUS W/ TWL XL LVL3 (GOWN DISPOSABLE) ×2 IMPLANT
GOWN STRL REUS W/TWL LRG LVL3 (GOWN DISPOSABLE) ×3
GOWN STRL REUS W/TWL XL LVL3 (GOWN DISPOSABLE) ×6
KIT BASIN OR (CUSTOM PROCEDURE TRAY) ×3 IMPLANT
KIT TURNOVER KIT B (KITS) ×3 IMPLANT
MANIFOLD NEPTUNE II (INSTRUMENTS) ×3 IMPLANT
NDL HYPO 25GX1X1/2 BEV (NEEDLE) IMPLANT
NEEDLE HYPO 25GX1X1/2 BEV (NEEDLE) IMPLANT
NS IRRIG 1000ML POUR BTL (IV SOLUTION) ×3 IMPLANT
PACK ORTHO EXTREMITY (CUSTOM PROCEDURE TRAY) ×3 IMPLANT
PAD ARMBOARD 7.5X6 YLW CONV (MISCELLANEOUS) ×3 IMPLANT
PAD CAST 4YDX4 CTTN HI CHSV (CAST SUPPLIES) ×1 IMPLANT
PADDING CAST COTTON 4X4 STRL (CAST SUPPLIES) ×3
PADDING CAST SYNTHETIC 4 (CAST SUPPLIES) ×2
PADDING CAST SYNTHETIC 4X4 STR (CAST SUPPLIES) IMPLANT
SCRUB BETADINE 4OZ XXX (MISCELLANEOUS) ×3 IMPLANT
SOL PREP POV-IOD 4OZ 10% (MISCELLANEOUS) ×3 IMPLANT
SPONGE LAP 4X18 X RAY DECT (DISPOSABLE) ×3 IMPLANT
SUT CHROMIC 4 0 PS 2 18 (SUTURE) ×2 IMPLANT
SUT PROLENE 3 0 PS 2 (SUTURE) ×4 IMPLANT
SUT PROLENE 4 0 PS 2 18 (SUTURE) ×6 IMPLANT
SWAB CULTURE ESWAB REG 1ML (MISCELLANEOUS) IMPLANT
SYR CONTROL 10ML LL (SYRINGE) IMPLANT
TOWEL OR 17X24 6PK STRL BLUE (TOWEL DISPOSABLE) ×3 IMPLANT
TOWEL OR 17X26 10 PK STRL BLUE (TOWEL DISPOSABLE) ×3 IMPLANT
TUBE CONNECTING 12'X1/4 (SUCTIONS) ×1
TUBE CONNECTING 12X1/4 (SUCTIONS) ×2 IMPLANT
WATER STERILE IRR 1000ML POUR (IV SOLUTION) ×3 IMPLANT
YANKAUER SUCT BULB TIP NO VENT (SUCTIONS) ×3 IMPLANT

## 2017-10-28 NOTE — Anesthesia Postprocedure Evaluation (Signed)
Anesthesia Post Note  Patient: [name]  Procedure(s) Performed: REPEAT IRRIGATION AND DEBRIDEMENT RIGHT HAND (Right Arm Lower)     Patient location during evaluation: PACU Anesthesia Type: General Level of consciousness: sedated Pain management: pain level controlled Vital Signs Assessment: post-procedure vital signs reviewed and stable Respiratory status: spontaneous breathing and respiratory function stable Cardiovascular status: stable Postop Assessment: no apparent nausea or vomiting Anesthetic complications: no    Last Vitals:  Vitals:   10/28/17 0950 10/28/17 1005  BP: 125/87 124/88  Pulse: 82 91  Resp: (!) 9 13  Temp:    SpO2: 100% 99%    Last Pain:  Vitals:   10/28/17 1005  TempSrc:   PainSc: 0-No pain                 Tamani Durney DANIEL

## 2017-10-28 NOTE — Progress Notes (Signed)
[facility]                                                                              Patient Demographics  [name], is a 42 y.o. male, DOB - 06-Sep-1975, ZOX:[id number]  Admit date - 10/26/2017   Admitting Physician Renae Fickle, MD  Outpatient Primary MD for the patient is Renaye Rakers, MD  Outpatient specialists:   LOS - 2  days    Chief Complaint  Patient presents with  . Hand Injury       Brief summary  [name] is a 42 y.o. male with history of asthma and kidney stones who presents with right hand pain and admitted for right hand soft tissue infection      Assessment & Plan    Principal Problem:   Hand abscess Active Problems:   Asthma   Cigarette nicotine dependence with withdrawal  #1 right hand suggestive of infection: Status post incision and drainage by orthopedist 10/26/2017 Pain control IV antibiotics Elevate the affected extremity Follow-up cultures A1c 5.8% HIV pending Orthopedics consulted  Code Status: Full code DVT Prophylaxis:  SCD's Family Communication: Discussed in detail with the patient, all imaging results, lab results explained to the patient    Disposition Plan: Home  Time Spent in minutes    Procedures:  Incision and drainage with irrigation  Consultants: Orthopedist  Antimicrobials:      Medications  Scheduled Meds: . acetaminophen  1,000 mg Oral TID  . docusate sodium  100 mg Oral BID  . famotidine  20 mg Oral Daily  . nicotine  21 mg Transdermal Daily  . polyethylene glycol  17 g Oral Daily  . senna  1 tablet Oral BID  . sodium chloride flush  3 mL Intravenous Q12H  . vitamin C  1,000 mg Oral Daily   Continuous Infusions: . methocarbamol (ROBAXIN)  IV Stopped (10/27/17 0130)  . piperacillin-tazobactam (ZOSYN)  IV 3.375 g (10/28/17 0631)  . vancomycin Stopped (10/27/17 2145)   PRN Meds:.albuterol, ALPRAZolam, bisacodyl, diphenhydrAMINE, HYDROmorphone (DILAUDID)  injection, LORazepam, magnesium citrate, methocarbamol (ROBAXIN)  IV, ondansetron **OR** ondansetron (ZOFRAN) IV, oxyCODONE   Antibiotics   Anti-infectives (From admission, onward)   Start     Dose/Rate Route Frequency Ordered Stop   10/27/17 0600  vancomycin (VANCOCIN) IVPB 1000 mg/200 mL premix  Status:  Discontinued     1,000 mg 200 mL/hr over 60 Minutes Intravenous On call to O.R. 10/26/17 1756 10/26/17 2224   10/26/17 2200  vancomycin (VANCOCIN) IVPB 1000 mg/200 mL premix     1,000 mg 200 mL/hr over 60 Minutes Intravenous Every 12 hours 10/26/17 1040     10/26/17 1800  piperacillin-tazobactam (ZOSYN) IVPB 3.375 g     3.375 g 12.5 mL/hr over 240 Minutes Intravenous Every 8 hours 10/26/17 1040     10/26/17 1757  vancomycin (VANCOCIN) 1-5 GM/200ML-% IVPB    Note to Pharmacy:  Lorenda Ishihara   : cabinet override      10/26/17 1757 10/26/17 1947   10/26/17 1015  piperacillin-tazobactam (ZOSYN) IVPB 3.375 g     3.375 g 100 mL/hr over 30 Minutes Intravenous  Once 10/26/17 1006 10/26/17 1244   10/26/17 1015  vancomycin (VANCOCIN) IVPB 1000 mg/200 mL premix     1,000 mg 200 mL/hr over 60 Minutes Intravenous  Once 10/26/17 1006 10/26/17 1243   10/26/17 0545  ceFAZolin (ANCEF) IVPB 1 g/50 mL premix     1 g 100 mL/hr over 30 Minutes Intravenous  Once 10/26/17 0533 10/26/17 0741        Subjective:   [name] was seen and examined today.   Patient denies fever or chills. Just came back from OR after second I&D  Objective:   Vitals:   10/28/17 1005 10/28/17 1022 10/28/17 1025 10/28/17 1043  BP: 124/88 112/82  131/83  Pulse: 91 88 83 92  Resp: 13 13 11 14   Temp:   (!) 97.5 F (36.4 C) 97.9 F (36.6 C)  TempSrc:    Oral  SpO2: 99% 100% 100% 98%  Weight:      Height:        Intake/Output Summary (Last 24 hours) at 10/28/2017 1111 Last data filed at 10/28/2017 1610 Gross per 24 hour  Intake 943 ml  Output 2195 ml  Net -1252 ml     Wt Readings from Last 3 Encounters:    10/26/17 68 kg (150 lb)  09/08/17 61.7 kg (136 lb)  11/08/16 70.3 kg (155 lb)     Exam  General: NAD  HEENT: NCAT,  PERRL,MMM  Neck: SUPPLE, (-) JVD  Cardiovascular: RRR, (-) GALLOP, (-) MURMUR  Respiratory: CTA  Gastrointestinal: SOFT, (-) DISTENSION, BS(+), (_) TENDERNESS  Ext: (-) CYANOSIS, postoperative dressing change for affected extremity with elevated in a cast   neuro: A, OX 3  Skin:(-) RASH  Psych:NORMAL AFFECT/MOOD   Data Reviewed:  I have personally reviewed following labs and imaging studies  Micro Results Recent Results (from the past 240 hour(s))  Culture, blood (routine x 2)     Status: None (Preliminary result)   Collection Time: 10/26/17  6:01 AM  Result Value Ref Range Status   Specimen Description   Final    BLOOD LEFT ANTECUBITAL Performed at [facility], [address], [place], [place] [address]    Special Requests   Final    BOTTLES DRAWN AEROBIC AND ANAEROBIC Blood Culture adequate volume Performed at [facility], [address] [place], [place] [address]    Culture   Final    NO GROWTH 1 DAY Performed at [facility], [address] [place], [place] [address]    Report Status PENDING  Incomplete  Culture, blood (routine x 2)     Status: None (Preliminary result)   Collection Time: 10/26/17  6:15 AM  Result Value Ref Range Status   Specimen Description   Final    BLOOD LEFT HAND Performed at [facility], [address], [place], [place] [address]    Special Requests   Final    BOTTLES DRAWN AEROBIC AND ANAEROBIC Blood Culture results may not be optimal due to an inadequate volume of blood received in culture bottles Performed at [facility], [address] [place], [place] [address]    Culture   Final    NO GROWTH 1 DAY Performed at [facility], [address], [place], [place] [address]    Report Status PENDING  Incomplete  MRSA  PCR Screening     Status: None   Collection Time: 10/26/17  5:51 PM  Result Value Ref Range Status  MRSA by PCR NEGATIVE NEGATIVE Final    Comment:        The GeneXpert MRSA Assay (FDA approved for NASAL specimens only), is one component of a comprehensive MRSA colonization surveillance program. It is not intended to diagnose MRSA infection nor to guide or monitor treatment for MRSA infections. Performed at [facility], [address] [place], [place] [address]   Aerobic/Anaerobic Culture (surgical/deep wound)     Status: None (Preliminary result)   Collection Time: 10/26/17  7:29 PM  Result Value Ref Range Status   Specimen Description ABSCESS RIGHT HAND  Final   Special Requests PATIENT ON FOLLOWING ZOSYN  Final   Gram Stain   Final    ABUNDANT WBC PRESENT,BOTH PMN AND MONONUCLEAR FEW GRAM POSITIVE COCCI    Culture   Final    CULTURE REINCUBATED FOR BETTER GROWTH Performed at [facility], [address] [place], [place] [address]    Report Status PENDING  Incomplete    Radiology Reports Dg Hand Complete Right  Result Date: 10/26/2017 CLINICAL DATA:  Pain and swelling in the right hand for 3 days. Pus oozing from the thumbnail. EXAM: RIGHT HAND - COMPLETE 3+ VIEW COMPARISON:  Right first finger 09/08/2017 FINDINGS: Diffuse soft tissue swelling predominantly involving the first finger and dorsal aspect of the right hand. Suggestion of tiny foreign material projected over the distal right first and second fingers. This could represent superficial contamination or soft tissue foreign bodies. No soft tissue gas. No bone destruction or cortical erosion. No acute fracture or dislocation. IMPRESSION: Diffuse soft tissue swelling of the right hand predominantly involving the right first finger and dorsum. Foreign bodies versus surface contamination over the distal first and second fingers. No evidence of osteomyelitis or acute bony injury. Electronically Signed   By:  Burman NievesWilliam  Stevens M.D.   On: 10/26/2017 05:58    Lab Data:  CBC: Recent Labs  Lab 10/26/17 0614 10/27/17 0529 10/28/17 0638  WBC 13.1* 15.2* 11.0*  NEUTROABS 10.2*  --   --   HGB 13.6 11.5* 10.8*  HCT 41.5 35.8* 33.9*  MCV 88.7 88.2 87.6  PLT 288 249 257   Basic Metabolic Panel: Recent Labs  Lab 10/26/17 0614 10/27/17 0529 10/28/17 0638  NA 138 137 141  K 3.6 3.6 4.0  CL 103 104 106  CO2 24 23 24   GLUCOSE 99 153* 96  BUN 11 8 12   CREATININE 0.94 1.02 1.11  CALCIUM 9.8 8.5* 8.4*   GFR: Estimated Creatinine Clearance: 82.7 mL/min (by C-G formula based on SCr of 1.11 mg/dL). Liver Function Tests: Recent Labs  Lab 10/26/17 0614  AST 21  ALT 17  ALKPHOS 115  BILITOT 0.6  PROT 7.5  ALBUMIN 4.1   No results for input(s): LIPASE, AMYLASE in the last 168 hours. No results for input(s): AMMONIA in the last 168 hours. Coagulation Profile: No results for input(s): INR, PROTIME in the last 168 hours. Cardiac Enzymes: No results for input(s): CKTOTAL, CKMB, CKMBINDEX, TROPONINI in the last 168 hours. BNP (last 3 results) No results for input(s): PROBNP in the last 8760 hours. HbA1C: Recent Labs    10/26/17 0614  HGBA1C 5.8*   CBG: No results for input(s): GLUCAP in the last 168 hours. Lipid Profile: No results for input(s): CHOL, HDL, LDLCALC, TRIG, CHOLHDL, LDLDIRECT in the last 72 hours. Thyroid Function Tests: No results for input(s): TSH, T4TOTAL, FREET4, T3FREE, THYROIDAB in the last 72 hours. Anemia Panel: No results for  input(s): VITAMINB12, FOLATE, FERRITIN, TIBC, IRON, RETICCTPCT in the last 72 hours. Urine analysis:    Component Value Date/Time   COLORURINE YELLOW 08/03/2015 0040   APPEARANCEUR CLEAR 08/03/2015 0040   LABSPEC 1.025 08/03/2015 0040   PHURINE 6.0 08/03/2015 0040   GLUCOSEU NEGATIVE 08/03/2015 0040   HGBUR NEGATIVE 08/03/2015 0040   BILIRUBINUR NEGATIVE 08/03/2015 0040   KETONESUR NEGATIVE 08/03/2015 0040   PROTEINUR NEGATIVE  08/03/2015 0040   UROBILINOGEN 1.0 01/18/2014 1310   NITRITE NEGATIVE 08/03/2015 0040   LEUKOCYTESUR MODERATE (A) 08/03/2015 0040     Jackie Plum M.D. [facility] 10/28/2017, 11:11 AM  Pager: [phone] Between 7am to 7pm - call Pager - [phone]  After 7pm go to [email or web address] - password [email or web address]  Call night coverage person covering after 7pm
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
## 2017-10-28 NOTE — Transfer of Care (Signed)
Immediate Anesthesia Transfer of Care Note  Patient: [name]  Procedure(s) Performed: REPEAT IRRIGATION AND DEBRIDEMENT RIGHT HAND (Right Arm Lower)  Patient Location: PACU  Anesthesia Type:General  Level of Consciousness: awake, alert , oriented and patient cooperative  Airway & Oxygen Therapy: Patient Spontanous Breathing and Patient connected to nasal cannula oxygen  Post-op Assessment: Report given to RN, Post -op Vital signs reviewed and stable and Patient moving all extremities  Post vital signs: Reviewed and stable  Last Vitals:  Vitals Value Taken Time  BP 108/84 10/28/2017  9:06 AM  Temp    Pulse 97 10/28/2017  9:06 AM  Resp 13 10/28/2017  9:06 AM  SpO2 100 % 10/28/2017  9:06 AM  Vitals shown include unvalidated device data.  Last Pain:  Vitals:   10/28/17 0445  TempSrc: Oral  PainSc:          Complications: No apparent anesthesia complications

## 2017-10-28 NOTE — Anesthesia Procedure Notes (Signed)
Procedure Name: LMA Insertion Date/Time: 10/28/2017 7:40 AM Performed by: Lucinda Dellecarlo, Iza Preston M, CRNA Pre-anesthesia Checklist: Patient identified, Emergency Drugs available, Suction available and Patient being monitored Patient Re-evaluated:Patient Re-evaluated prior to induction Oxygen Delivery Method: Circle system utilized Preoxygenation: Pre-oxygenation with 100% oxygen Induction Type: IV induction LMA: LMA inserted LMA Size: 4.0 Tube type: Oral Number of attempts: 1 Placement Confirmation: positive ETCO2 and breath sounds checked- equal and bilateral Tube secured with: Tape Dental Injury: Teeth and Oropharynx as per pre-operative assessment

## 2017-10-28 NOTE — Anesthesia Preprocedure Evaluation (Signed)
Anesthesia Evaluation  Patient identified by MRN, date of birth, ID band Patient awake    Reviewed: Allergy & Precautions, H&P , NPO status , Patient's Chart, lab work & pertinent test results, reviewed documented beta blocker date and time   History of Anesthesia Complications Negative for: history of anesthetic complications  Airway Mallampati: II  TM Distance: >3 FB Neck ROM: full    Dental no notable dental hx. (+) Dental Advisory Given   Pulmonary asthma , Current Smoker,    Pulmonary exam normal  (-) wheezing      Cardiovascular negative cardio ROS   Rhythm:regular Rate:Normal     Neuro/Psych negative neurological ROS  negative psych ROS   GI/Hepatic negative GI ROS, Neg liver ROS,   Endo/Other  negative endocrine ROS  Renal/GU negative Renal ROS     Musculoskeletal negative musculoskeletal ROS (+)   Abdominal   Peds  Hematology negative hematology ROS (+)   Anesthesia Other Findings infection  Reproductive/Obstetrics                             Anesthesia Physical  Anesthesia Plan  ASA: II  Anesthesia Plan: General   Post-op Pain Management:    Induction: Intravenous  PONV Risk Score and Plan: 1 and Ondansetron, Dexamethasone and Treatment may vary due to age or medical condition  Airway Management Planned: LMA  Additional Equipment:   Intra-op Plan:   Post-operative Plan: Extubation in OR  Informed Consent: I have reviewed the patients History and Physical, chart, labs and discussed the procedure including the risks, benefits and alternatives for the proposed anesthesia with the patient or authorized representative who has indicated his/her understanding and acceptance.   Dental advisory given  Plan Discussed with: CRNA and Anesthesiologist  Anesthesia Plan Comments:         Anesthesia Quick Evaluation

## 2017-10-28 NOTE — Consult Note (Signed)
  Cultures pend Stable on rounds yesterday Plan for repeat I&D today Discussed with patient yesterday and will proceed We have discussed with the patient the issues regarding their infection to the extremity. We will continue antibiotics and await culture results. Often times it will take 3-5 days for cultures to become final. During this time we will typically have the patient on intravenous antibiotics until we can find a parenteral route of antibiotic regime specific for the bacteria or organism isolated. We have discussed with the patient the need for daily irrigation and debridement as well as therapy to the area. We have discussed with the patient the necessity of range of motion to the involved joints as discussed today. We have discussed with the patient the unpredictability of infections at times. We'll continue to work towards good pain control and restoration of function. The patient understands the need for meticulous wound care and the necessity of proper followup.  The possible complications of stiffness (loss of motion), resistant infection, possible deep bone infection, possible chronic pain issues, possible need for multiple surgeries and even amputation.  With this in mind the patient understands our goal is to eradicate the infection to quiesence. We will continue to work towards these goals.  We are planning surgery for your upper extremity. The risk and benefits of surgery to include risk of bleeding, infection, anesthesia,  damage to normal structures and failure of the surgery to accomplish its intended goals of relieving symptoms and restoring function have been discussed in detail. With this in mind we plan to proceed. I have specifically discussed with the patient the pre-and postoperative regime and the dos and don'ts and risk and benefits in great detail. Risk and benefits of surgery also include risk of dystrophy(CRPS), chronic nerve pain, failure of the healing process to go  onto completion and other inherent risks of surgery The relavent the pathophysiology of the disease/injury process, as well as the alternatives for treatment and postoperative course of action has been discussed in great detail with the patient who desires to proceed.  We will do everything in our power to help you (the patient) restore function to the upper extremity. It is a pleasure to see this patient today.  Colbie Danner MD

## 2017-10-28 NOTE — Op Note (Signed)
NAMJuluis Mata:  Hemphill, Terrie                ACCOUNT NO.:  [id number]  MEDICAL RECORD NO.:  [id number]  LOCATION:  6N30C                        FACILITY:  [facility]  PHYSICIAN:  Dionne AnoWilliam M. Odin Mariani, M.D.DATE OF BIRTH:  [birth year]  DATE OF PROCEDURE:  10/28/2017 DATE OF DISCHARGE:                              OPERATIVE REPORT   PREOPERATIVE DIAGNOSES: 1. Right thumb deep abscess with flexor pollicis longus infectious     tenosynovitis. 2. Flexor tendon infectious tenosynovitis at the wrist, status post I     and D, median nerve release, neurolysis, etc.  POSTOPERATIVE DIAGNOSES: 1. Right thumb deep abscess with flexor pollicis longus infectious     tenosynovitis. 2. Flexor tendon infectious tenosynovitis at the wrist, status post I     and D, median nerve release, neurolysis, etc.  PROCEDURE: 1. Repeat irrigation and debridement, thumb and wrist as well as     thenar space and first dorsal interosseous region, status post     fasciotomy.  This was an irrigation and debridement of skin,     subcutaneous tissue, muscle with curette, knife, and scissors tip     used excisional in nature. 2. Radical tenolysis, tenosynovectomy flexor pollicis longus tendon,     right upper extremity. 3. Radical flexor tenolysis tenosynovectomy, wrist and forearm. 4. Median nerve neurolysis, right wrist and forearm.  SURGEON:  Dionne AnoWilliam M. Amanda PeaGramig, MD.  ASSISTANT:  None.  COMPLICATIONS:  None.  ANESTHESIA:  General.  DRAINS:  One.  TOURNIQUET TIME:  Less than an hour.  INDICATIONS FOR THE PROCEDURE:  A 42 year old male [facility].  I have counseled him in regard to risks and benefits of surgery and he desires to proceed.  OPERATIVE PROCEDURE:  The patient was seen by myself and Anesthesia, taken to the operative theater and underwent smooth induction of general anesthetic.  Arm was prepped with 2 Hibiclens scrubs, followed by Betadine scrub and paint  performed all by myself.  Arm was elevated, tourniquet was insufflated and the patient underwent I and D of skin, subcutaneous tissue, tendon and deep structures.  This was an excisional debridement with curette, knife, and scissors.  Following this, the patient's wrist underwent an irrigation and debridement with removal of devitalized tissue.  The median nerve underwent an extensive neurolysis.  The FDP to the index through small finger, FPL and the FDS to the index through small finger all underwent a tenolysis, tenosynovectomy extensive in nature. Following this, I made sure the median nerve was stable.  The neurolysis was performed under 4.0 loupe magnification without difficulty and there were no complicating features.  Following neurolysis, I then irrigated with 6 L of saline.  I did irrigate and debride of course the thenar space area and the first dorsal interosseous, which previously underwent fasciotomy and I and D.  Following this, I had placed a drain from the tip of the thumb out through the forearm and then closed the wounds loosely with tourniquet deflated and hemostasis secured.  Once again, tenolysis and tenosynovectomy were accomplished without difficulty and the patient had excellent refill with soft compartments.  Wound conditions were generally much improved.  There was no reaccumulation  of abscess or pus/purulent material if he will.  The patient tolerated the procedure well.  There were no complicating features.  He was dressed in a thumb spica splint and will be maximally elevated and continued on antibiotics.  His cultures have not finalized any growth, but do show early bacterial species.  It was pleasure to see him today and participate in his care plan.  This is a very difficult infection.  We will continue aggressive efforts at its resolution.     Dionne Ano. Amanda Pea, M.D.     [facility]  D:  10/28/2017  T:  10/28/2017  Job:  [id number]

## 2017-10-28 NOTE — Progress Notes (Signed)
Patient signed the AMA form and left against medical advice. The attending MD, Dr. Julio Sickssei-Bonsu, was notified by text page.

## 2017-10-28 NOTE — Op Note (Signed)
See full dictation [id number]  Status post surgical debridement of the right upper extremity including thumb forearm and wrist.  This was a extensive radical tenosynovectomy.  There is no aggressive accumulation/reaccumulation of infectious fluid and wound conditions look stable to closed over drain.  I would recommend continue vancomycin and Zosyn and watch cultures carefully.  We will continue maximum elevation and aggressive efforts to eradicate this very aggressive infection.  It was a pleasure to see him today.  Overall much improved conditions however it will be a long course of care postoperatively and with necessary [facility]hab [facility] if we are to give him a functional hand.  Tre Sanker MD

## 2017-10-29 ENCOUNTER — Encounter (HOSPITAL_COMMUNITY): Payer: Self-pay | Admitting: Orthopedic Surgery

## 2017-10-31 LAB — AEROBIC/ANAEROBIC CULTURE W GRAM STAIN (SURGICAL/DEEP WOUND)

## 2017-10-31 LAB — CULTURE, BLOOD (ROUTINE X 2)
Culture: NO GROWTH
Culture: NO GROWTH
Special Requests: ADEQUATE

## 2017-10-31 LAB — AEROBIC/ANAEROBIC CULTURE (SURGICAL/DEEP WOUND)

## 2017-12-26 ENCOUNTER — Emergency Department (HOSPITAL_COMMUNITY)
Admission: EM | Admit: 2017-12-26 | Discharge: 2017-12-27 | Disposition: A | Discharge status: Home / Self Care | Payer: Medicaid Other | Attending: Emergency Medicine | Admitting: Emergency Medicine

## 2017-12-26 ENCOUNTER — Encounter (HOSPITAL_COMMUNITY): Payer: Self-pay | Admitting: Emergency Medicine

## 2017-12-26 DIAGNOSIS — F17200 Nicotine dependence, unspecified, uncomplicated: Secondary | ICD-10-CM | POA: Insufficient documentation

## 2017-12-26 DIAGNOSIS — J45909 Unspecified asthma, uncomplicated: Secondary | ICD-10-CM | POA: Insufficient documentation

## 2017-12-26 DIAGNOSIS — R238 Other skin changes: Secondary | ICD-10-CM

## 2017-12-26 MED ORDER — PERMETHRIN 5 % EX CREA
TOPICAL_CREAM | Freq: Once | CUTANEOUS | Status: AC
  Administered 2017-12-27: via TOPICAL
  Filled 2017-12-26: qty 60

## 2017-12-26 MED ORDER — LORAZEPAM 1 MG PO TABS
1.0000 mg | ORAL_TABLET | Freq: Once | ORAL | Status: AC
  Administered 2017-12-27: 1 mg via ORAL
  Filled 2017-12-26: qty 1

## 2017-12-26 NOTE — ED Triage Notes (Addendum)
Patient reports " parasites " in his skin at hands and face for several weeks , denies itching / no rashes noted.
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
## 2017-12-27 MED ORDER — LORAZEPAM 1 MG PO TABS: 1.0000 mg | ORAL_TABLET | Freq: Every day | ORAL | 0 refills | Status: DC

## 2017-12-27 NOTE — Discharge Instructions (Addendum)
Follow up with your choice of dermatologist for further evaluation of skin irritation. Take Ativan at night to help with sleep. Use topical cream as directed.

## 2018-01-09 ENCOUNTER — Inpatient Hospital Stay: Payer: Self-pay

## 2018-01-09 NOTE — Progress Notes (Deleted)
Patient ID: [name], male   DOB: 1975/10/20, 42 y.o.   MRN: [id number] Patient seen in ED 12/26/2017 for rash and sensation of parasites or bug bites.  No rash or evidence of skin infection; however, elimite was prescribed and ativan was given to help with sleep.  It was recommended he see dermatology.
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
## 2018-11-11 IMAGING — CR DG FINGER THUMB 2+V*R*
3 series · 3 of 3 positions shown · non-contrast
Comparison: None.

CLINICAL DATA: Pain.  Infection.

EXAM:
RIGHT THUMB 2+V

[x finger pa right (1 of 2)]
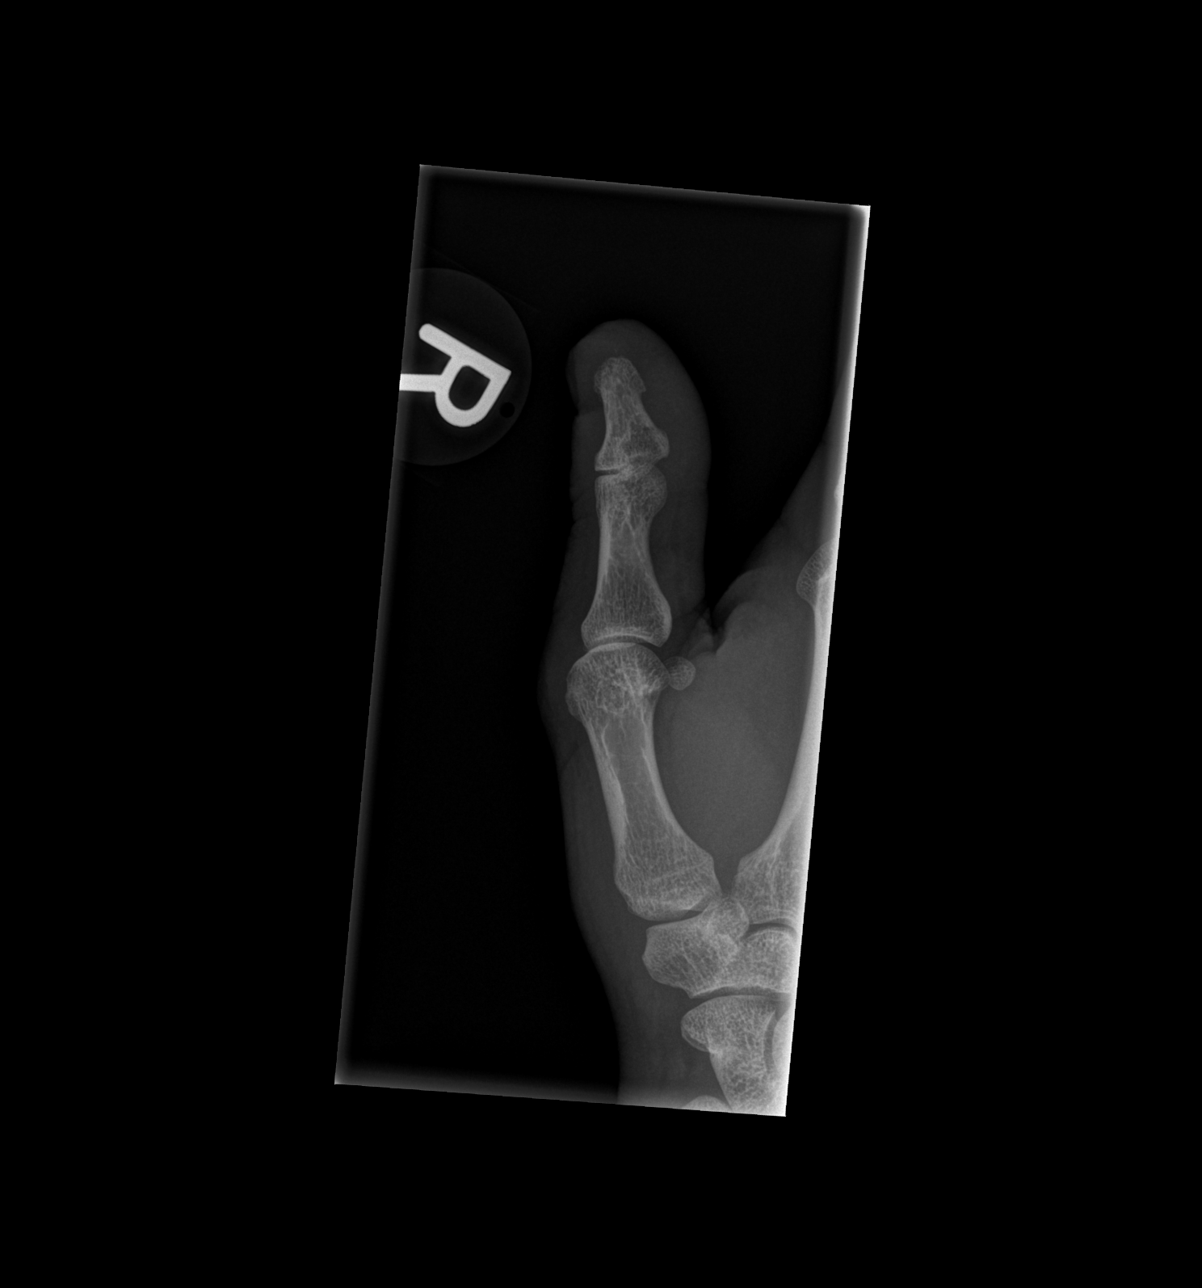

[x finger obl right]
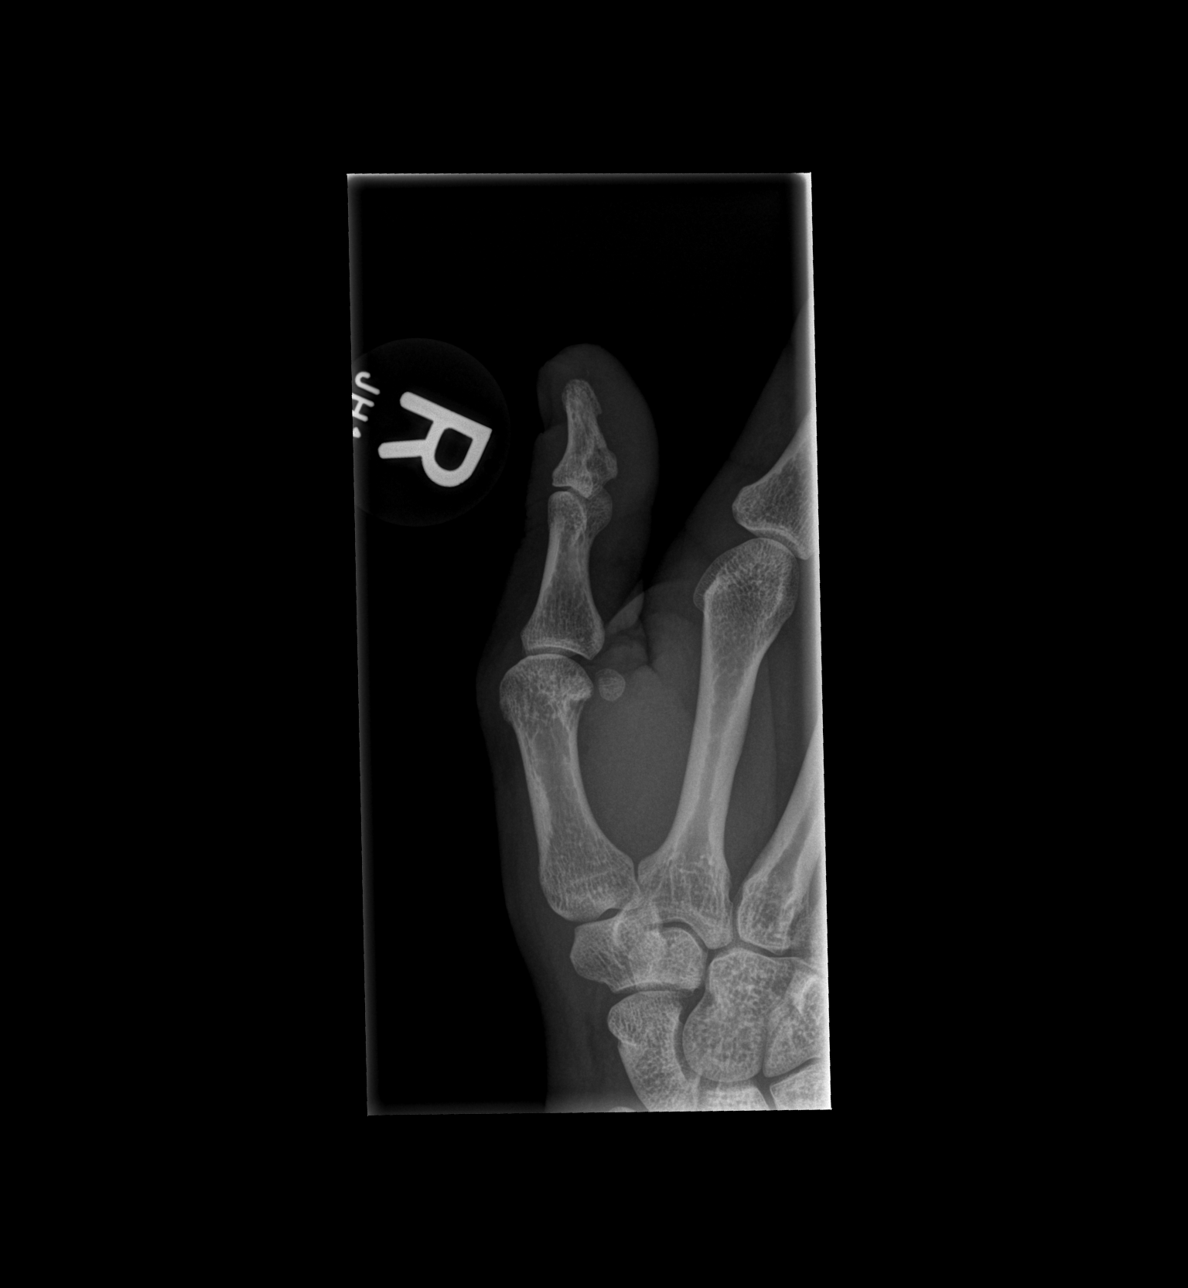

[x finger pa right (2 of 2)]
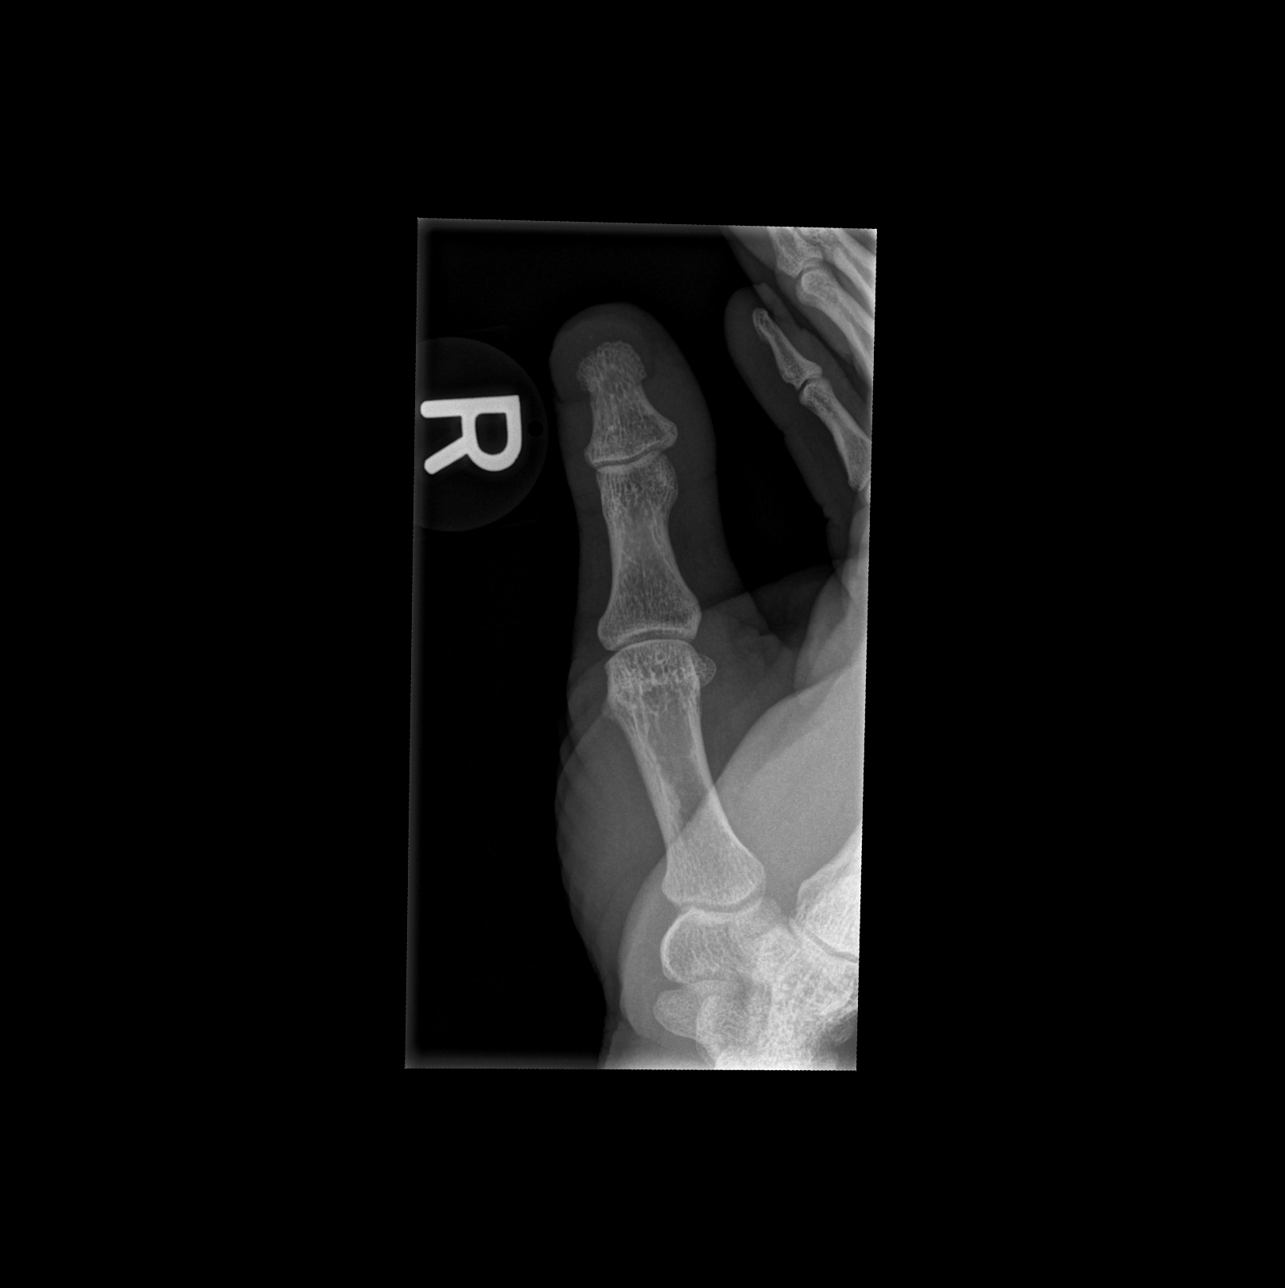

[3 of 3 positions shown; findings below may reference images not displayed]

FINDINGS: There is no evidence of fracture or dislocation. There is no
evidence of arthropathy or other focal bone abnormality. Soft
tissues are unremarkable
IMPRESSION: Negative.

## 2018-12-12 ENCOUNTER — Other Ambulatory Visit: Payer: Self-pay

## 2018-12-12 ENCOUNTER — Emergency Department (HOSPITAL_COMMUNITY)
Admission: EM | Admit: 2018-12-12 | Discharge: 2018-12-12 | Disposition: A | Discharge status: Home / Self Care | Payer: Self-pay | Attending: Emergency Medicine | Admitting: Emergency Medicine

## 2018-12-12 ENCOUNTER — Emergency Department (HOSPITAL_COMMUNITY): Payer: Self-pay

## 2018-12-12 DIAGNOSIS — F17200 Nicotine dependence, unspecified, uncomplicated: Secondary | ICD-10-CM | POA: Insufficient documentation

## 2018-12-12 DIAGNOSIS — Z79899 Other long term (current) drug therapy: Secondary | ICD-10-CM | POA: Insufficient documentation

## 2018-12-12 DIAGNOSIS — J45909 Unspecified asthma, uncomplicated: Secondary | ICD-10-CM | POA: Insufficient documentation

## 2018-12-12 DIAGNOSIS — M79641 Pain in right hand: Secondary | ICD-10-CM | POA: Insufficient documentation

## 2018-12-12 MED ORDER — ACETAMINOPHEN 325 MG PO TABS
650.0000 mg | ORAL_TABLET | Freq: Once | ORAL | Status: AC
  Administered 2018-12-12: 17:00:00 650 mg via ORAL
  Filled 2018-12-12: qty 2

## 2018-12-12 MED ORDER — DOXYCYCLINE HYCLATE 100 MG PO CAPS: 100.0000 mg | ORAL_CAPSULE | Freq: Two times a day (BID) | ORAL | 0 refills | Status: DC

## 2018-12-12 NOTE — ED Notes (Signed)
Patient transported to X-ray 

## 2018-12-12 NOTE — ED Triage Notes (Signed)
C/o finger pain + swelling on right middle finger x 1 week.

## 2018-12-12 NOTE — Discharge Instructions (Addendum)
You have been seen today for hand pain. Please read and follow all provided instructions.   1. Medications: doxycycline (antibiotic), ibuprofen/tylenol for pain, usual home medications 2. Treatment: rest, drink plenty of fluids 3. Follow Up: Please return for any new or worsening symptoms. Please follow up with your primary doctor in 2 days for discussion of your diagnoses and further evaluation after today's visit; if you do not have a primary care doctor use the resource guide provided to find one; Please return to the ER for any new or worsening symptoms. Please obtain all of your results from medical records or have your doctors office obtain the results - share them with your doctor - you should be seen at your doctors office. Call today to arrange your follow up.   Take medications as prescribed. Please review all of the medicines and only take them if you do not have an allergy to them. Return to the emergency room for worsening condition or new concerning symptoms. Follow up with your regular doctor. If you don't have a regular doctor use one of the numbers below to establish a primary care doctor.  Please be aware that if you are taking birth control pills, taking other prescriptions, ESPECIALLY ANTIBIOTICS may make the birth control ineffective - if this is the case, either do not engage in sexual activity or use alternative methods of birth control such as condoms until you have finished the medicine and your family doctor says it is OK to restart them. If you are on a blood thinner such as COUMADIN, be aware that any other medicine that you take may cause the coumadin to either work too much, or not enough - you should have your coumadin level rechecked in next 7 days if this is the case.  ?  It is also a possibility that you have an allergic reaction to any of the medicines that you have been prescribed - Everybody reacts differently to medications and while MOST people have no trouble with  most medicines, you may have a reaction such as nausea, vomiting, rash, swelling, shortness of breath. If this is the case, please stop taking the medicine immediately and contact your physician.  ?  You should return to the ER if you develop severe or worsening symptoms.   Emergency Department Resource Guide 1) Find a Doctor and Pay Out of Pocket Although you won't have to find out who is covered by your insurance plan, it is a good idea to ask around and get recommendations. You will then need to call the office and see if the doctor you have chosen will accept you as a new patient and what types of options they offer for patients who are self-pay. Some doctors offer discounts or will set up payment plans for their patients who do not have insurance, but you will need to ask so you aren't surprised when you get to your appointment.  2) Contact Your Local Health Department Not all health departments have doctors that can see patients for sick visits, but many do, so it is worth a call to see if yours does. If you don't know where your local health department is, you can check in your phone book. The CDC also has a tool to help you locate your state's health department, and many state websites also have listings of all of their local health departments.  3) Find a Walk-in Clinic If your illness is not likely to be very severe or complicated, you may  want to try a walk in clinic. These are popping up all over the country in pharmacies, drugstores, and shopping centers. They're usually staffed by nurse practitioners or physician assistants that have been trained to treat common illnesses and complaints. They're usually fairly quick and inexpensive. However, if you have serious medical issues or chronic medical problems, these are probably not your best option.  No Primary Care Doctor: Call [facility] Connect at  [phone] - they can help you locate a primary care doctor that  accepts your insurance, provides  certain services, etc. Physician Referral Service- [phone]  Chronic Pain Problems: Organization         Address  Phone   Notes  [facility]  [phone] Patients need to be referred by their primary care doctor.   Medication Assistance: Organization         Address  Phone   Notes  [facility] Medication [facility] [address] [place], [place] [address] [phone] --Must be a resident of [facility] -- Must have NO insurance coverage whatsoever (no Medicaid/ Medicare, etc.) -- The pt. MUST have a primary care doctor that directs their care regularly and follows them in the community   [facility]  [phone]   [employer]  [phone]    Agencies that provide inexpensive medical care: Organization         Address  Phone   Notes  [facility] [place]  [phone]   [facility]    [phone]   [facility] [place], [place] [address] [phone]   [place] [address] [place] [phone]   [facility]    [phone]   [facility]    [phone]   [place] [address], [place] Phone:  [phone], Fax:  [phone] Hours of Operation:  9 am - 6 pm, M-F.  Also accepts Medicaid/Medicare and self-pay.  [facility] for [place] [address], [address], [place] Phone: [phone], Fax: [phone]. Hours of Operation:  8:30 am - 5:30 pm, M-F.  Also accepts Medicaid and self-pay.  [facility] [place] [address], [place] Phone: [phone]   [place], [place], [place] [phone], Ext. [phone] Mondays & Thursdays: 7-9 AM.  First 15 patients are seen on a first come, first serve basis.    [place] Providers:  Organization         Address  Phone   Notes  [facility] [address], [place] [phone] Also accepts self-pay patients.  [facility] [address], [place]  [phone]   [place], [address], [place] [phone]   [facility] [address] [place] [phone]   Lucianne Lei [address], [place]   [phone] Only accepts [place] [facility] [place] patients after they have their name applied to their card.   Self-Pay (no insurance) in [facility]:  Organization         Address  Phone   Notes  Sickle Cell Patients, [facility] [place] [phone]   [facility] [place] [phone]  [facility] [place]  [address], [address], [place] [phone]   [facility]/Dr. Osei-Bonsu  [address] [place] or [address], [place] [phone] Phone number for both [place] and [employer] [place] locations is the same.  [facility] [address], [place] [phone]   [facility] [address] [place] or [address] [phone]   [facility] [address], [place] [phone], phone; [phone], fax Sees patients 1st and 3rd Saturday of every month.  Must not qualify for public or private insurance (i.e. Medicaid, Medicare, [employer], Veterans' Benefits)  Household income should be no more than 200% of the poverty level The clinic cannot treat you if you are pregnant or think you are pregnant  Sexually transmitted diseases are not treated at the clinic.
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
## 2018-12-15 ENCOUNTER — Emergency Department (HOSPITAL_COMMUNITY)
Admission: EM | Admit: 2018-12-15 | Discharge: 2018-12-15 | Disposition: A | Discharge status: Home / Self Care | Payer: Medicaid Other | Attending: Emergency Medicine | Admitting: Emergency Medicine

## 2018-12-15 ENCOUNTER — Encounter (HOSPITAL_COMMUNITY): Payer: Self-pay | Admitting: *Deleted

## 2018-12-15 ENCOUNTER — Other Ambulatory Visit: Payer: Self-pay

## 2018-12-15 DIAGNOSIS — F1290 Cannabis use, unspecified, uncomplicated: Secondary | ICD-10-CM | POA: Insufficient documentation

## 2018-12-15 DIAGNOSIS — F17210 Nicotine dependence, cigarettes, uncomplicated: Secondary | ICD-10-CM | POA: Insufficient documentation

## 2018-12-15 DIAGNOSIS — L03011 Cellulitis of right finger: Secondary | ICD-10-CM | POA: Insufficient documentation

## 2018-12-15 MED ORDER — LIDOCAINE HCL (PF) 1 % IJ SOLN
5.0000 mL | Freq: Once | INTRAMUSCULAR | Status: AC
  Administered 2018-12-15: 12:00:00 5 mL
  Filled 2018-12-15: qty 5

## 2018-12-15 MED ORDER — HYDROCODONE-ACETAMINOPHEN 5-325 MG PO TABS
2.0000 | ORAL_TABLET | Freq: Once | ORAL | Status: AC
Start: 2018-12-15 — End: 2018-12-15
  Administered 2018-12-15: 12:00:00 2 via ORAL
  Filled 2018-12-15: qty 2

## 2018-12-15 NOTE — Discharge Instructions (Addendum)
You have been diagnosed today with right middle finger abscess.  At this time there does not appear to be the presence of an emergent medical condition, however there is always the potential for conditions to change. Please read and follow the below instructions.  Please return to the Emergency Department immediately for any new or worsening symptoms. Please be sure to follow up with your Primary Care Provider within one week regarding your visit today; please call their office to schedule an appointment even if you are feeling better for a follow-up visit. Please continue taking your antibiotic medication doxycycline as previously prescribed to help fight the infection. Please soak your hand in warm clean soapy water 4 times a day to help continue drainage of your infection.  After each soak replace the dressing with a clean bandage. Call Dr. Carlos Levering office tomorrow to schedule a follow-up appointment regarding your right finger abscess.  Follow-up with your hand specialist Dr. Amanda Pea is very important to ensure improvement of your symptoms.  Get help right away if: You have severe pain or bleeding. You cannot eat or drink without vomiting. You have decreased urine output. You become short of breath. You have chest pain. You cough up blood. The area where the incision and drainage occurred becomes numb or it tingles. You have fever/chills Any new/concerning or worsening symptoms   Please read the additional information packets attached to your discharge summary.
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
## 2018-12-15 NOTE — ED Triage Notes (Signed)
PT last seen on Thursday for swelling  Of  Rt middle finger and started on anti-bx . Pt reports the finger  Is not better. On last visit to ED Pt was instructed to come back if finger was not better.in 48hrs.

## 2021-05-30 ENCOUNTER — Encounter (HOSPITAL_COMMUNITY): Payer: Self-pay | Admitting: Emergency Medicine

## 2021-05-30 ENCOUNTER — Ambulatory Visit: Payer: Self-pay | Admitting: *Deleted

## 2021-05-30 ENCOUNTER — Other Ambulatory Visit: Payer: Self-pay

## 2021-05-30 ENCOUNTER — Emergency Department (HOSPITAL_COMMUNITY)
Admission: EM | Admit: 2021-05-30 | Discharge: 2021-05-30 | Disposition: A | Discharge status: Home / Self Care | Payer: Medicaid Other | Attending: Emergency Medicine | Admitting: Emergency Medicine

## 2021-05-30 DIAGNOSIS — J45909 Unspecified asthma, uncomplicated: Secondary | ICD-10-CM | POA: Insufficient documentation

## 2021-05-30 DIAGNOSIS — L853 Xerosis cutis: Secondary | ICD-10-CM

## 2021-05-30 DIAGNOSIS — F17210 Nicotine dependence, cigarettes, uncomplicated: Secondary | ICD-10-CM | POA: Insufficient documentation

## 2021-05-30 DIAGNOSIS — L989 Disorder of the skin and subcutaneous tissue, unspecified: Secondary | ICD-10-CM | POA: Insufficient documentation

## 2021-05-30 NOTE — ED Triage Notes (Signed)
Patient reports chronic bilateral hand pain for several years worse today , denies injury.

## 2021-05-30 NOTE — Telephone Encounter (Addendum)
Reason for Disposition  [1] MODERATE pain (e.g., interferes with normal activities) AND [2] present > 3 days  Answer Assessment - Initial Assessment Questions 1. ONSET: "When did the pain start?"     Pt calling in c/o severe pain in his hands and arms.   I feel like something is crawling on me.   Nothing is there except a film like something on my arm.   Thick white stuff is coming out of my skin.   It's a thick stuff coming out.   I'll try to cut it.   It's like something is poking me.    My hands right now looks like it's rubber.  It's a white thick stuff.   I can cut it off with my nail clippers. I went to the hospital for this morning 3:30 AM.   I went to ED.   The lady told me to call y'all and that what I had was not an emergency.   I was only there 20 minutes at [facility] emergency room This same thing happened 3 yrs ago.   They told me I was having a bad drug reaction.    The diagnosed dry skin dermatitis on my paperwork from ED this morning.   They told me to see y'all [facility].   I've never been seen there before. 2. LOCATION: "Where is the pain located?"     Both hands.   Only if I feel something on my arms do I see it.   "It sticks out".  It hurts to touch my arms.    "My arms look regular".   "My hands you can see lumps around the nail areas". 3. PAIN: "How bad is the pain?" (Scale 1-10; or mild, moderate, severe)   - MILD (1-3): doesn't interfere with normal activities   - MODERATE (4-7): interferes with normal activities (e.g., work or school) or awakens from sleep   - SEVERE (8-10): excruciating pain, unable to use hand at all     I'm sitting in the car now.   It's 5/10.   When walking my dogs it's hard to hold the leash.   It's hard to touch the steering wheel when I drive.  4. WORK OR EXERCISE: "Has there been any recent work or exercise that involved this part (i.e., hand or wrist) of the body?"     No new exposures to soaps. 5. CAUSE: "What do you think  is causing the pain?"     I'm baffled.   I'm at a loss.   My sister is a CNA and she looked at it.   There's something under my skin but I don't know what it is. 6. AGGRAVATING FACTORS: "What makes the pain worse?" (e.g., using computer)     Right now I have coffee.   My right hand is worse.   When I pick up the cup with my thumb and pointed finger that's all I can use to put the cup into my left hand.   My left hand is not as bad. 7. OTHER SYMPTOMS: "Do you have any other symptoms?" (e.g., neck pain, swelling, rash, numbness, fever)     I've had this 3 yrs ago.   It bothered me for several months.   I was seen and given lotions and creams back then.   I've never been seen by a doctor for this.   I have been to the ED at least 3-4 times for this same thing.  This has been going on for 3 yrs.    I still don't know what is wrong with me. My hands have a certain smell when the white stuff is coming out.   My right hand is swollen.   My left hand is swollen too but not nearly as bad.   I can't make a fist.  The pain is too bad for me to bend my fingers in my right hand.   I can make a fist with my left hand but it hurts bad.  I don't have insurance.   8. PREGNANCY: "Is there any chance you are pregnant?" "When was your last menstrual period?"     N/A  Protocols used: Hand and Wrist Pain-A-AH See triage notes.    This pt called in because the ED at [facility] gave him our number to [facility].   He is not a pt with them.  He went to the ED this morning at 3:30 AM and they referred him to [facility].   "They did not see me other than to tell me I needed to call this number for y'all". My computer froze up so I let him know I would need to call him back.  He gave me his number [phone].  After rebooting my computer I called him

## 2021-05-30 NOTE — Discharge Instructions (Signed)
For definitive identification of what is causing your skin condition, please follow up with your choice of primary care provider. The information for 2 clinics have been provided for your information.
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
## 2021-05-30 NOTE — Telephone Encounter (Signed)
I returned my call to the pt after rebooting my computer because it froze during our call.  There are no new pt appts available until Dec and Jan.   I let pt know I would send a note and have someone from [facility] call him back to get scheduled.  He was agreeable to this plan.  I sent his information to [facility] asking them to please set him up for a new pt appt and evaluation.      Pt can be reached at [phone].

## 2021-06-03 ENCOUNTER — Other Ambulatory Visit: Payer: Self-pay

## 2021-06-03 ENCOUNTER — Ambulatory Visit: Payer: Self-pay | Attending: Internal Medicine | Admitting: Internal Medicine

## 2021-06-03 ENCOUNTER — Encounter: Payer: Self-pay | Admitting: Internal Medicine

## 2021-06-03 VITALS — BP 113/73 | HR 101 | Resp 16 | Wt 156.0 lb

## 2021-06-03 DIAGNOSIS — Z7689 Persons encountering health services in other specified circumstances: Secondary | ICD-10-CM

## 2021-06-03 DIAGNOSIS — L309 Dermatitis, unspecified: Secondary | ICD-10-CM

## 2021-06-03 DIAGNOSIS — F321 Major depressive disorder, single episode, moderate: Secondary | ICD-10-CM

## 2021-06-03 DIAGNOSIS — F17200 Nicotine dependence, unspecified, uncomplicated: Secondary | ICD-10-CM

## 2021-06-03 MED ORDER — TRIAMCINOLONE ACETONIDE 0.1 % EX CREA
1.0000 "application " | TOPICAL_CREAM | Freq: Two times a day (BID) | CUTANEOUS | 1 refills | Status: AC
  Filled 2021-06-03: qty 30, 15d supply, fill #0

## 2021-06-03 MED ORDER — TRIAMCINOLONE ACETONIDE 0.1 % EX CREA: 1.0000 "application " | TOPICAL_CREAM | Freq: Two times a day (BID) | CUTANEOUS | 1 refills | Status: DC

## 2021-06-03 NOTE — Patient Instructions (Signed)
Please apply for the orange card/cone discount card so that we can refer you to the dermatologist.  Use triamcinolone cream on the hands twice a day.  Purchased Curel lotion over-the-counter and use it on the hands several times a day. Purchased Aveeno Bath packs over-the-counter and use them about 3 times a week as we had discussed.
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
## 2021-06-27 ENCOUNTER — Telehealth: Payer: Self-pay | Admitting: Clinical

## 2021-06-27 NOTE — Telephone Encounter (Signed)
I attempted to reach pt following a request from PCP:  Patient with depression.  He used to be seen at [facility] but stopped going a year ago.  He is not interested in being placed on medication but feels he would benefit from some counseling.   No answer, left vm.

## 2021-09-27 ENCOUNTER — Ambulatory Visit (HOSPITAL_COMMUNITY)
Admission: EM | Admit: 2021-09-27 | Discharge: 2021-09-27 | Disposition: A | Discharge status: Home / Self Care | Payer: No Payment, Other | Attending: Psychiatry | Admitting: Psychiatry

## 2021-09-27 DIAGNOSIS — Z720 Tobacco use: Secondary | ICD-10-CM | POA: Insufficient documentation

## 2021-09-27 DIAGNOSIS — Z046 Encounter for general psychiatric examination, requested by authority: Secondary | ICD-10-CM | POA: Insufficient documentation

## 2021-09-27 DIAGNOSIS — Z8659 Personal history of other mental and behavioral disorders: Secondary | ICD-10-CM | POA: Insufficient documentation

## 2021-09-27 DIAGNOSIS — F1591 Other stimulant use, unspecified, in remission: Secondary | ICD-10-CM | POA: Insufficient documentation

## 2021-09-27 NOTE — ED Notes (Signed)
Patient is alert  and oriented X 4, received summary of visit with community resources. Patient denies SI, HI and AVH at time of discharge. All belongings received from [facility]. ?

## 2021-09-27 NOTE — ED Triage Notes (Signed)
Pt presents to [facility] seeking a mental health evaluation for his probation officer. Pt states that his probation officer recommended that he come here to be psych cleared as a part of his probation. Pt reports no psyh history. Pt denies SI/HI and AVH. ?

## 2021-09-27 NOTE — ED Provider Notes (Signed)
Behavioral Health Urgent Care Medical Screening Exam ? ?Patient Name: [name] ?MRN: [id number] ?Date of Evaluation: 09/27/21 ?Chief Complaint:   ?Diagnosis:  ?Final diagnoses:  ?Psychiatric exam requested by authority  ? ? ?History of Present illness: [name] is a 46 y.o. male with history of substance use and depression who presents voluntarily to the [place] urgent care for a mental health evaluation as requested by his Research officer, trade union.  ? ?Patient seen and chart reviewed-per chart review patient was previously followed by [facility].  Patient interviewed in assessment room-he is calm, cooperative and pleasant throughout interview.  He states that he presented to the behavioral health urgent care for an assessment that is "court ordered".  Patient states that he used to follow up at [facility] and was prescribed Zoloft and Risperdal.  Patient states that he was prescribed these medications when he was "coming off of drugs and hearing voices and" as well as experiencing paranoia.  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  Patient states that he was incarcerated from December 2022 to January 2023.  Patient states that he has remained sober since his release and denies feeling depressed and  reports not currently taking any medication.  He denies suicidal thoughts, homicidal thoughts, auditory hallucinations, visual hallucinations, paranoia. ? ? ? ?Past Psychiatric History: ?Previous Medication Trials: zoloft and risperdal ?Previous Psychiatric Hospitalizations: denies ?Previous Suicide Attempts: denies ?Outpatient psychiatrist: denies ? ?Social History: ?Children: 3- aged 16,14,11. States that his 45 yo is in therapeutic foster care and that he has custody of his other children ?Source of Income: Environmental manager ?Housing Status: with mother and 2 children ?History of phys/sexual abuse: denies ?Easy access to gun: denies ? ?Substance Use  ?Recreational  Drugs: denies use since released from incarceration in jan 2023 ?Use of Alcohol: denied ?Tobacco Use: yes ?Rehab History: attended [facility] last year. Reports he completed program and was able to remain sober for ~3 months ?H/O Complicated Withdrawal: denies ? ?Legal History: ?Past Charges/Incarcerations: yes ?Pending charges: denies ? ?Family Psychiatric History: ?Denies history of mental  illness, substance use and suicide attempts ? ? ? ?Psychiatric Specialty Exam ? ?Presentation  ?General Appearance:Appropriate for Environment; Casual ? ?Eye Contact:Good ? ?Speech:Clear and Coherent; Normal Rate ? ?Speech Volume:Normal ? ?Handedness:No data recorded ? ?Mood and Affect  ?Mood:Euthymic ?Affect:Appropriate; Congruent; Full Range ? ?Thought Process  ?Thought Processes:Coherent; Goal Directed; Linear ?Descriptions of Associations:Intact ? ?Orientation:Full (Time, Place and Person) ? ?Thought Content:WDL; Logical (No paranoia/AVH/IOR) ?   Hallucinations:None ? ?Ideas of Reference:None ? ?Suicidal Thoughts:No ? ?Homicidal Thoughts:No ? ? ?Sensorium  ?Memory:Immediate Good; Recent Good; Remote Good ?Judgment:Good ?Insight:Good ? ?Executive Functions  ?Concentration:Good ?Attention Span:Good ?Recall:Good ?Fund of [place] ?Language:Good ? ?Psychomotor Activity  ?Psychomotor Activity:Normal ? ?Assets  ?Assets:Communication Skills; Desire for Improvement; Housing; Resilience; Social Support; Physical Health; Vocational/Educational ? ?Sleep  ?Sleep:Fair ?Number of hours: No data recorded ? ?No data recorded ? ?Physical Exam: ?Physical Exam ?Constitutional:   ?   Appearance: Normal appearance. He is normal weight.  ?HENT:  ?   Head: Normocephalic and atraumatic.  ?Eyes:  ?   Extraocular Movements: Extraocular movements intact.  ?Pulmonary:  ?   Effort: Pulmonary effort is normal.  ?Neurological:  ?   General: No focal deficit present.  ?   Mental Status: He is alert and oriented to person, place, and time.   ?Psychiatric:     ?   Attention and Perception: Attention and perception normal.     ?  Speech: Speech normal.     ?   Behavior: Behavior normal. Behavior is cooperative.     ?   Thought Content: Thought content normal.  ? ?Review of Systems  ?Constitutional:  Negative for chills and fever.  ?HENT:  Negative for hearing loss.   ?Eyes:  Negative for discharge and redness.  ?Respiratory:  Negative for cough.   ?Cardiovascular:  Negative for chest pain.  ?Gastrointestinal:  Negative for abdominal pain.  ?Musculoskeletal:  Negative for myalgias.  ?Neurological:  Negative for headaches.  ?Psychiatric/Behavioral:  Negative for depression, hallucinations, substance abuse and suicidal ideas.   ?Blood pressure 134/83, pulse 80, temperature 98.5 ?F (36.9 ?C), temperature source Oral, resp. rate 18, SpO2 100 %. There is no height or weight on file to calculate BMI. ? ?Musculoskeletal: ?Strength & Muscle Tone: within normal limits ?Gait & Station: normal ?Patient leans: N/A ? ? ?[facility] MSE Discharge Disposition for Follow up and Recommendations: ?Based on my evaluation the patient does not appear to have an emergency medical condition and can be discharged with resources and follow up care in outpatient services. ? ?On my interview, patient is in NAD, alert, oriented, calm, cooperative, and attentive, with normal affect, speech, and behavior. Objectively, there is no evidence of psychosis/ mania (able to converse coherently, linear and goal directed thought, no RIS, no distractibility, not pre-occupied, no FOI, etc) nor depression to the point of suicidality (able to concentrate, affect full and reactive, speech normal r/v/t, no psychomotor retardation/agitation, etc). ? ?Overall, patient appears to be at the point, in the absence of inhibiting or disinhibiting symptoms, where he can successfully move to lesser restrictive setting for care. ? ?Patient is appropriate for discharge at this time.  Discussed with patient that in  the [facility] behavioral health urgent care crisis assessments are done and that if and more in depth assessment is needed he may present to the behavioral health center on the second floor during walk-in hours. Information provided in AVS ? ? ? ?Ival Bible, MD ?09/27/2021, 10:02 AM ? ?

## 2021-09-27 NOTE — Discharge Instructions (Signed)
° °  Please come to [facility] (this facility) during walk in hours for appointment with psychiatrist for further medication management and for therapists for therapy.  ° ° Walk in hours are 8-11 AM Monday through Thursday for medication management.Child and adolescent psychiatrists are only available on Wednesdays and Thursdays during walk in hours.  °Therapy walk in hours are Monday-Wednesday 8 AM-1PM.   It is first come, first -serve; it is best to arrive by 7:00 AM.  ° °On Friday from 1 pm to 4 pm for therapy intake only. Please arrive by 12:00 pm as it is  first come, first -serve.   ° °When you arrive please go upstairs for your appointment. If you are unsure of where to go, inform the front desk that you are here for a walk in appointment and they will assist you with directions upstairs. ° °Address:  °[address], in [place], [address] °Ph: [phone]  ° °

## 2021-10-13 ENCOUNTER — Telehealth (HOSPITAL_COMMUNITY): Payer: Self-pay

## 2021-10-13 NOTE — BH Assessment (Signed)
Care Management - Follow Up [facility] Discharges   Writer made contact with the patient. Patient reports that he has a follow up appointment with [facility].  

## 2024-01-14 ENCOUNTER — Emergency Department (HOSPITAL_COMMUNITY)
Admission: EM | Admit: 2024-01-14 | Discharge: 2024-01-14 | Disposition: A | Discharge status: Home / Self Care | Attending: Emergency Medicine | Admitting: Emergency Medicine

## 2024-01-14 ENCOUNTER — Emergency Department (HOSPITAL_COMMUNITY)

## 2024-01-14 ENCOUNTER — Encounter (HOSPITAL_COMMUNITY): Payer: Self-pay

## 2024-01-14 ENCOUNTER — Other Ambulatory Visit: Payer: Self-pay

## 2024-01-14 DIAGNOSIS — R109 Unspecified abdominal pain: Secondary | ICD-10-CM | POA: Diagnosis present

## 2024-01-14 LAB — COMPREHENSIVE METABOLIC PANEL WITH GFR
ALT: 22 U/L (ref 0–44)
AST: 24 U/L (ref 15–41)
Albumin: 3.4 g/dL — ABNORMAL LOW (ref 3.5–5.0)
Alkaline Phosphatase: 83 U/L (ref 38–126)
Anion gap: 7 (ref 5–15)
BUN: 8 mg/dL (ref 6–20)
CO2: 25 mmol/L (ref 22–32)
Calcium: 8.7 mg/dL — ABNORMAL LOW (ref 8.9–10.3)
Chloride: 108 mmol/L (ref 98–111)
Creatinine, Ser: 1.14 mg/dL (ref 0.61–1.24)
GFR, Estimated: >60 mL/min (ref >60)
Glucose, Bld: 51 mg/dL — ABNORMAL LOW (ref 70–99)
Potassium: 4.3 mmol/L (ref 3.5–5.1)
Sodium: 140 mmol/L (ref 135–145)
Total Bilirubin: 0.5 mg/dL (ref 0.0–1.2)
Total Protein: 6.1 g/dL — ABNORMAL LOW (ref 6.5–8.1)

## 2024-01-14 LAB — CBC WITH DIFFERENTIAL/PLATELET
Abs Immature Granulocytes: 0.03 10*3/uL (ref 0.00–0.07)
Basophils Absolute: 0.1 10*3/uL (ref 0.0–0.1)
Basophils Relative: 1 %
Eosinophils Absolute: 0.5 10*3/uL (ref 0.0–0.5)
Eosinophils Relative: 8 %
HCT: 45.8 % (ref 39.0–52.0)
Hemoglobin: 14.7 g/dL (ref 13.0–17.0)
Immature Granulocytes: 1 %
Lymphocytes Relative: 26 %
Lymphs Abs: 1.5 10*3/uL (ref 0.7–4.0)
MCH: 29.9 pg (ref 26.0–34.0)
MCHC: 32.1 g/dL (ref 30.0–36.0)
MCV: 93.3 fL (ref 80.0–100.0)
Monocytes Absolute: 0.5 10*3/uL (ref 0.1–1.0)
Monocytes Relative: 9 %
Neutro Abs: 3.2 10*3/uL (ref 1.7–7.7)
Neutrophils Relative %: 55 %
Platelets: 270 10*3/uL (ref 150–400)
RBC: 4.91 MIL/uL (ref 4.22–5.81)
RDW: 13.1 % (ref 11.5–15.5)
WBC: 5.7 10*3/uL (ref 4.0–10.5)
nRBC: 0 % (ref 0.0–0.2)

## 2024-01-14 LAB — URINALYSIS, W/ REFLEX TO CULTURE (INFECTION SUSPECTED)
Bacteria, UA: NONE SEEN
Bilirubin Urine: NEGATIVE
Glucose, UA: NEGATIVE mg/dL
Hgb urine dipstick: NEGATIVE
Ketones, ur: NEGATIVE mg/dL
Leukocytes,Ua: NEGATIVE
Nitrite: NEGATIVE
Protein, ur: NEGATIVE mg/dL
Specific Gravity, Urine: 1.008 (ref 1.005–1.030)
pH: 5 (ref 5.0–8.0)

## 2024-01-14 LAB — CBG MONITORING, ED: Glucose-Capillary: 88 mg/dL (ref 70–99)

## 2024-01-14 MED ORDER — KETOROLAC TROMETHAMINE 30 MG/ML IJ SOLN
30.0000 mg | Freq: Once | INTRAMUSCULAR | Status: AC
  Administered 2024-01-14: 30 mg via INTRAMUSCULAR
  Filled 2024-01-14: qty 1

## 2024-01-14 MED ORDER — NAPROXEN 500 MG PO TABS: 500.0000 mg | ORAL_TABLET | Freq: Two times a day (BID) | ORAL | 0 refills | Status: AC

## 2024-01-14 MED ORDER — METHOCARBAMOL 500 MG PO TABS: 1000.0000 mg | ORAL_TABLET | Freq: Three times a day (TID) | ORAL | 0 refills | Status: AC | PRN

## 2024-01-14 MED ORDER — METHYLPREDNISOLONE 4 MG PO TBPK: ORAL_TABLET | ORAL | 0 refills | Status: AC

## 2024-01-14 MED ORDER — OXYCODONE HCL 5 MG PO TABS
5.0000 mg | ORAL_TABLET | Freq: Once | ORAL | Status: AC
  Administered 2024-01-14: 5 mg via ORAL
  Filled 2024-01-14: qty 1
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
## 2024-01-14 NOTE — Discharge Instructions (Addendum)
 Please read and follow all provided instructions.  Your diagnoses today include:  1. Flank pain     Tests performed today include: Complete blood cell count: No concerning findings Complete metabolic panel: No concerning findings Urinalysis (urine test): Sign of infection or bleeding Ultrasound of your abdomen: Was normal except for stone noted inside of the right kidney Vital signs. See below for your results today.   Medications prescribed:  Medrol Dosepak: Steroid medication for back pain  Naproxen  - anti-inflammatory pain medication Do not exceed 500mg  naproxen  every 12 hours, take with food  You have been prescribed an anti-inflammatory medication or NSAID. Take with food. Take smallest effective dose for the shortest duration needed for your pain. Stop taking if you experience stomach pain or vomiting.   Robaxin  (methocarbamol ) - muscle relaxer medication  DO NOT drive or perform any activities that require you to be awake and alert because this medicine can make you drowsy.   Take any prescribed medications only as directed.  Home care instructions:  Follow any educational materials contained in this packet.  Follow-up instructions: Please follow-up with your primary care provider in the next 7 days for further evaluation of your symptoms.    Return instructions:  SEEK IMMEDIATE MEDICAL ATTENTION IF: The pain does not go away or becomes severe  A temperature above 101F develops  Repeated vomiting occurs (multiple episodes)  The pain becomes localized to portions of the abdomen. The right side could possibly be appendicitis. In an adult, the left lower portion of the abdomen could be colitis or diverticulitis.  Blood is being passed in stools or vomit (bright red or black tarry stools)  You develop chest pain, difficulty breathing, dizziness or fainting, or become confused, poorly responsive, or inconsolable (young children) If you have any other emergent concerns  regarding your health  Additional Information: Abdominal (belly) pain can be caused by many things. Your caregiver performed an examination and possibly ordered blood/urine tests and imaging (CT scan, x-rays, ultrasound). Many cases can be observed and treated at home after initial evaluation in the emergency department. Even though you are being discharged home, abdominal pain can be unpredictable. Therefore, you need a repeated exam if your pain does not resolve, returns, or worsens. Most patients with abdominal pain don't have to be admitted to the hospital or have surgery, but serious problems like appendicitis and gallbladder attacks can start out as nonspecific pain. Many abdominal conditions cannot be diagnosed in one visit, so follow-up evaluations are very important.  Your vital signs today were: BP (!) 130/90   Pulse 62   Temp 98.1 F (36.7 C) (Oral)   Resp 16   SpO2 100%  If your blood pressure (bp) was elevated above 135/85 this visit, please have this repeated by your doctor within one month. --------------

## 2024-01-14 NOTE — ED Triage Notes (Signed)
 Pt c.o right sided flank pain that radiates to his lower back, down his left leg, and to his groin pain is worse when he tries to bare any weight on his leg. Pt has hx of kidney stones and states this feels different.

## 2024-01-14 NOTE — ED Provider Triage Note (Signed)
 Emergency Medicine Provider Triage Evaluation Note  [name] , a 48 y.o. male  was evaluated in triage.  Pt complains of back pain and abdominal pain..  Review of Systems  Positive: Pain Negative: Fevers or dysuria.  Physical Exam  BP 129/73 (BP Location: Right Arm)   Pulse 82   Temp 98.2 F (36.8 C)   Resp 16   SpO2 98%  Right CVA tenderness.  Also right upper quadrant tenderness  Medical Decision Making  Medically screening exam initiated at 8:53 AM.  Appropriate orders placed.  [name] was informed that the remainder of the evaluation will be completed by another provider, this initial triage assessment does not replace that evaluation, and the importance of remaining in the ED until their evaluation is complete.  Patient with back pain.  Also has right upper quadrant tenderness.  Has had previous kidney stones but does not feel the same.  Ultrasound due to the tenderness.    Patrick Lot, MD 01/14/24 [phone]
